# Patient Record
Sex: Female | Born: 1935 | Race: White | Hispanic: No | Smoking: Never smoker
Health system: Southern US, Community
[De-identification: ages and names within clinical notes are randomized; demographics above are authoritative.]

## PROBLEM LIST (undated history)

## (undated) DIAGNOSIS — H9190 Unspecified hearing loss, unspecified ear: Secondary | ICD-10-CM

## (undated) DIAGNOSIS — K8020 Calculus of gallbladder without cholecystitis without obstruction: Secondary | ICD-10-CM

## (undated) DIAGNOSIS — N63 Unspecified lump in unspecified breast: Secondary | ICD-10-CM

## (undated) DIAGNOSIS — C50919 Malignant neoplasm of unspecified site of unspecified female breast: Secondary | ICD-10-CM

## (undated) DIAGNOSIS — R634 Abnormal weight loss: Secondary | ICD-10-CM

## (undated) DIAGNOSIS — G9520 Unspecified cord compression: Secondary | ICD-10-CM

## (undated) HISTORY — PX: CATARACT EXTRACTION: SUR2

## (undated) HISTORY — DX: Unspecified lump in unspecified breast: N63.0

## (undated) HISTORY — DX: Abnormal weight loss: R63.4

## (undated) HISTORY — DX: Calculus of gallbladder without cholecystitis without obstruction: K80.20

## (undated) HISTORY — DX: Unspecified hearing loss, unspecified ear: H91.90

## (undated) HISTORY — DX: Unspecified cord compression: G95.20

---

## 2007-01-31 ENCOUNTER — Ambulatory Visit: Payer: Self-pay | Admitting: Oncology

## 2007-01-31 ENCOUNTER — Inpatient Hospital Stay (HOSPITAL_COMMUNITY): Admission: EM | Admit: 2007-01-31 | Discharge: 2007-02-05 | Payer: Self-pay | Admitting: Emergency Medicine

## 2007-01-31 ENCOUNTER — Ambulatory Visit: Payer: Self-pay | Admitting: Internal Medicine

## 2007-02-02 ENCOUNTER — Encounter (INDEPENDENT_AMBULATORY_CARE_PROVIDER_SITE_OTHER): Payer: Self-pay | Admitting: Diagnostic Radiology

## 2007-02-02 ENCOUNTER — Encounter: Admission: RE | Admit: 2007-02-02 | Discharge: 2007-02-02 | Payer: Self-pay | Admitting: *Deleted

## 2007-02-07 ENCOUNTER — Ambulatory Visit: Admission: RE | Admit: 2007-02-07 | Discharge: 2007-02-28 | Payer: Self-pay | Admitting: Radiation Oncology

## 2007-02-08 ENCOUNTER — Ambulatory Visit: Payer: Self-pay | Admitting: Oncology

## 2007-02-09 LAB — CBC WITH DIFFERENTIAL/PLATELET
BASO%: 0 % (ref 0.0–2.0)
EOS%: 0 % (ref 0.0–7.0)
MCH: 30.4 pg (ref 26.0–34.0)
MCHC: 33.5 g/dL (ref 32.0–36.0)
MONO#: 0.8 10*3/uL (ref 0.1–0.9)
RBC: 3.55 10*6/uL — ABNORMAL LOW (ref 3.70–5.32)
RDW: 17.2 % — ABNORMAL HIGH (ref 11.3–14.5)
WBC: 7.6 10*3/uL (ref 3.9–10.0)
lymph#: 0.4 10*3/uL — ABNORMAL LOW (ref 0.9–3.3)

## 2007-02-09 LAB — COMPREHENSIVE METABOLIC PANEL
ALT: 18 U/L (ref 0–35)
AST: 33 U/L (ref 0–37)
CO2: 24 mEq/L (ref 19–32)
Calcium: 8.8 mg/dL (ref 8.4–10.5)
Chloride: 100 mEq/L (ref 96–112)
Creatinine, Ser: 0.88 mg/dL (ref 0.40–1.20)
Sodium: 138 mEq/L (ref 135–145)
Total Protein: 6.9 g/dL (ref 6.0–8.3)

## 2007-02-22 LAB — COMPREHENSIVE METABOLIC PANEL
AST: 42 U/L — ABNORMAL HIGH (ref 0–37)
Albumin: 3.5 g/dL (ref 3.5–5.2)
BUN: 23 mg/dL (ref 6–23)
Calcium: 8.5 mg/dL (ref 8.4–10.5)
Chloride: 98 mEq/L (ref 96–112)
Creatinine, Ser: 0.73 mg/dL (ref 0.40–1.20)
Glucose, Bld: 307 mg/dL — ABNORMAL HIGH (ref 70–99)
Potassium: 4.8 mEq/L (ref 3.5–5.3)

## 2007-02-22 LAB — CBC WITH DIFFERENTIAL/PLATELET
Basophils Absolute: 0 10*3/uL (ref 0.0–0.1)
EOS%: 0.2 % (ref 0.0–7.0)
Eosinophils Absolute: 0 10*3/uL (ref 0.0–0.5)
HCT: 33.6 % — ABNORMAL LOW (ref 34.8–46.6)
HGB: 11.8 g/dL (ref 11.6–15.9)
MCH: 31.3 pg (ref 26.0–34.0)
MCV: 89.4 fL (ref 81.0–101.0)
NEUT#: 5.5 10*3/uL (ref 1.5–6.5)
NEUT%: 94.7 % — ABNORMAL HIGH (ref 39.6–76.8)
RDW: 17.7 % — ABNORMAL HIGH (ref 11.3–14.5)
lymph#: 0.2 10*3/uL — ABNORMAL LOW (ref 0.9–3.3)

## 2007-02-22 LAB — LACTATE DEHYDROGENASE: LDH: 340 U/L — ABNORMAL HIGH (ref 94–250)

## 2007-02-24 LAB — BASIC METABOLIC PANEL
Chloride: 96 mEq/L (ref 96–112)
Potassium: 4.6 mEq/L (ref 3.5–5.3)

## 2007-03-16 LAB — COMPREHENSIVE METABOLIC PANEL
Albumin: 3.6 g/dL (ref 3.5–5.2)
Alkaline Phosphatase: 107 U/L (ref 39–117)
BUN: 21 mg/dL (ref 6–23)
Glucose, Bld: 155 mg/dL — ABNORMAL HIGH (ref 70–99)
Potassium: 4.8 mEq/L (ref 3.5–5.3)

## 2007-03-16 LAB — CBC WITH DIFFERENTIAL/PLATELET
Basophils Absolute: 0 10*3/uL (ref 0.0–0.1)
Eosinophils Absolute: 0 10*3/uL (ref 0.0–0.5)
HCT: 32.2 % — ABNORMAL LOW (ref 34.8–46.6)
HGB: 11 g/dL — ABNORMAL LOW (ref 11.6–15.9)
LYMPH%: 8.8 % — ABNORMAL LOW (ref 14.0–48.0)
MCV: 92.2 fL (ref 81.0–101.0)
MONO%: 6.4 % (ref 0.0–13.0)
NEUT#: 6.3 10*3/uL (ref 1.5–6.5)
Platelets: 240 10*3/uL (ref 145–400)
RDW: 19.5 % — ABNORMAL HIGH (ref 11.3–14.5)

## 2007-03-16 LAB — LACTATE DEHYDROGENASE: LDH: 374 U/L — ABNORMAL HIGH (ref 94–250)

## 2007-03-16 LAB — CANCER ANTIGEN 27.29: CA 27.29: 2196 U/mL — ABNORMAL HIGH (ref 0–39)

## 2007-04-06 ENCOUNTER — Ambulatory Visit: Payer: Self-pay | Admitting: Oncology

## 2007-04-08 LAB — CBC WITH DIFFERENTIAL/PLATELET
Basophils Absolute: 0 10*3/uL (ref 0.0–0.1)
HCT: 30 % — ABNORMAL LOW (ref 34.8–46.6)
HGB: 10 g/dL — ABNORMAL LOW (ref 11.6–15.9)
MONO#: 0.6 10*3/uL (ref 0.1–0.9)
NEUT%: 72.9 % (ref 39.6–76.8)
WBC: 6.3 10*3/uL (ref 3.9–10.0)
lymph#: 1 10*3/uL (ref 0.9–3.3)

## 2007-04-09 LAB — COMPREHENSIVE METABOLIC PANEL
BUN: 12 mg/dL (ref 6–23)
CO2: 25 mEq/L (ref 19–32)
Calcium: 8.4 mg/dL (ref 8.4–10.5)
Chloride: 106 mEq/L (ref 96–112)
Creatinine, Ser: 0.61 mg/dL (ref 0.40–1.20)

## 2007-04-09 LAB — LACTATE DEHYDROGENASE: LDH: 363 U/L — ABNORMAL HIGH (ref 94–250)

## 2007-04-09 LAB — CANCER ANTIGEN 27.29: CA 27.29: 1098 U/mL — ABNORMAL HIGH (ref 0–39)

## 2007-05-04 ENCOUNTER — Ambulatory Visit (HOSPITAL_COMMUNITY): Admission: RE | Admit: 2007-05-04 | Discharge: 2007-05-04 | Payer: Self-pay | Admitting: Oncology

## 2007-05-04 ENCOUNTER — Encounter (INDEPENDENT_AMBULATORY_CARE_PROVIDER_SITE_OTHER): Payer: Self-pay | Admitting: *Deleted

## 2007-05-04 LAB — CBC WITH DIFFERENTIAL/PLATELET
Basophils Absolute: 0 10*3/uL (ref 0.0–0.1)
Eosinophils Absolute: 0.4 10*3/uL (ref 0.0–0.5)
HCT: 29.1 % — ABNORMAL LOW (ref 34.8–46.6)
LYMPH%: 13.1 % — ABNORMAL LOW (ref 14.0–48.0)
MCV: 87.8 fL (ref 81.0–101.0)
MONO#: 0.8 10*3/uL (ref 0.1–0.9)
MONO%: 11.5 % (ref 0.0–13.0)
NEUT#: 4.9 10*3/uL (ref 1.5–6.5)
NEUT%: 68.9 % (ref 39.6–76.8)
Platelets: 369 10*3/uL (ref 145–400)
WBC: 7.1 10*3/uL (ref 3.9–10.0)

## 2007-05-05 LAB — IRON AND TIBC
Iron: 55 ug/dL (ref 42–145)
UIBC: 263 ug/dL

## 2007-05-05 LAB — COMPREHENSIVE METABOLIC PANEL
BUN: 13 mg/dL (ref 6–23)
CO2: 20 mEq/L (ref 19–32)
Creatinine, Ser: 0.63 mg/dL (ref 0.40–1.20)
Glucose, Bld: 130 mg/dL — ABNORMAL HIGH (ref 70–99)
Total Bilirubin: 0.6 mg/dL (ref 0.3–1.2)
Total Protein: 7.1 g/dL (ref 6.0–8.3)

## 2007-05-05 LAB — CANCER ANTIGEN 27.29: CA 27.29: 477 U/mL — ABNORMAL HIGH (ref 0–39)

## 2007-05-05 LAB — LACTATE DEHYDROGENASE: LDH: 435 U/L — ABNORMAL HIGH (ref 94–250)

## 2007-05-05 LAB — CEA: CEA: 70.4 ng/mL — ABNORMAL HIGH (ref 0.0–5.0)

## 2007-06-01 ENCOUNTER — Ambulatory Visit: Payer: Self-pay | Admitting: Oncology

## 2007-06-03 ENCOUNTER — Ambulatory Visit (HOSPITAL_COMMUNITY): Admission: RE | Admit: 2007-06-03 | Discharge: 2007-06-03 | Payer: Self-pay | Admitting: Oncology

## 2007-06-03 LAB — COMPREHENSIVE METABOLIC PANEL
AST: 22 U/L (ref 0–37)
Alkaline Phosphatase: 108 U/L (ref 39–117)
BUN: 16 mg/dL (ref 6–23)
Creatinine, Ser: 0.65 mg/dL (ref 0.40–1.20)
Glucose, Bld: 113 mg/dL — ABNORMAL HIGH (ref 70–99)
Potassium: 3.7 mEq/L (ref 3.5–5.3)
Total Bilirubin: 0.6 mg/dL (ref 0.3–1.2)

## 2007-06-03 LAB — CANCER ANTIGEN 27.29: CA 27.29: 331 U/mL — ABNORMAL HIGH (ref 0–39)

## 2007-06-03 LAB — CBC WITH DIFFERENTIAL/PLATELET
Basophils Absolute: 0 10*3/uL (ref 0.0–0.1)
EOS%: 9.6 % — ABNORMAL HIGH (ref 0.0–7.0)
Eosinophils Absolute: 0.5 10*3/uL (ref 0.0–0.5)
HGB: 10.5 g/dL — ABNORMAL LOW (ref 11.6–15.9)
LYMPH%: 18.4 % (ref 14.0–48.0)
MCH: 27.1 pg (ref 26.0–34.0)
MCV: 86.1 fL (ref 81.0–101.0)
MONO%: 12.6 % (ref 0.0–13.0)
NEUT#: 3 10*3/uL (ref 1.5–6.5)
Platelets: 329 10*3/uL (ref 145–400)
RDW: 19.7 % — ABNORMAL HIGH (ref 11.3–14.5)

## 2007-06-03 LAB — LACTATE DEHYDROGENASE: LDH: 221 U/L (ref 94–250)

## 2007-06-03 LAB — CEA: CEA: 55.3 ng/mL — ABNORMAL HIGH (ref 0.0–5.0)

## 2007-07-14 ENCOUNTER — Ambulatory Visit: Payer: Self-pay | Admitting: Oncology

## 2007-07-18 LAB — CBC WITH DIFFERENTIAL/PLATELET
Basophils Absolute: 0.1 10*3/uL (ref 0.0–0.1)
EOS%: 5.4 % (ref 0.0–7.0)
Eosinophils Absolute: 0.4 10*3/uL (ref 0.0–0.5)
HCT: 35.2 % (ref 34.8–46.6)
HGB: 11.8 g/dL (ref 11.6–15.9)
MCH: 28.4 pg (ref 26.0–34.0)
MCV: 84.7 fL (ref 81.0–101.0)
MONO%: 11.6 % (ref 0.0–13.0)
NEUT#: 4.3 10*3/uL (ref 1.5–6.5)
NEUT%: 60.4 % (ref 39.6–76.8)
Platelets: 227 10*3/uL (ref 145–400)
RDW: 17.1 % — ABNORMAL HIGH (ref 11.3–14.5)

## 2007-07-18 LAB — COMPREHENSIVE METABOLIC PANEL
AST: 21 U/L (ref 0–37)
Albumin: 4.1 g/dL (ref 3.5–5.2)
Alkaline Phosphatase: 97 U/L (ref 39–117)
Potassium: 3.9 mEq/L (ref 3.5–5.3)
Sodium: 143 mEq/L (ref 135–145)
Total Bilirubin: 0.7 mg/dL (ref 0.3–1.2)
Total Protein: 7.2 g/dL (ref 6.0–8.3)

## 2007-07-18 LAB — CEA: CEA: 45.5 ng/mL — ABNORMAL HIGH (ref 0.0–5.0)

## 2007-07-18 LAB — LACTATE DEHYDROGENASE: LDH: 216 U/L (ref 94–250)

## 2007-08-24 ENCOUNTER — Ambulatory Visit: Payer: Self-pay | Admitting: Oncology

## 2007-08-29 LAB — CBC WITH DIFFERENTIAL/PLATELET
Basophils Absolute: 0.1 10*3/uL (ref 0.0–0.1)
EOS%: 5.8 % (ref 0.0–7.0)
Eosinophils Absolute: 0.3 10*3/uL (ref 0.0–0.5)
HCT: 36.2 % (ref 34.8–46.6)
HGB: 11.8 g/dL (ref 11.6–15.9)
MONO#: 0.8 10*3/uL (ref 0.1–0.9)
NEUT#: 3 10*3/uL (ref 1.5–6.5)
NEUT%: 52.8 % (ref 39.6–76.8)
RDW: 16.4 % — ABNORMAL HIGH (ref 11.3–14.5)
WBC: 5.7 10*3/uL (ref 3.9–10.0)
lymph#: 1.5 10*3/uL (ref 0.9–3.3)

## 2007-08-30 LAB — COMPREHENSIVE METABOLIC PANEL
ALT: 12 U/L (ref 0–35)
AST: 21 U/L (ref 0–37)
Albumin: 4.1 g/dL (ref 3.5–5.2)
Alkaline Phosphatase: 83 U/L (ref 39–117)
Glucose, Bld: 94 mg/dL (ref 70–99)
Potassium: 4 mEq/L (ref 3.5–5.3)
Sodium: 142 mEq/L (ref 135–145)
Total Bilirubin: 0.8 mg/dL (ref 0.3–1.2)
Total Protein: 7.2 g/dL (ref 6.0–8.3)

## 2007-10-18 ENCOUNTER — Ambulatory Visit: Payer: Self-pay | Admitting: Oncology

## 2007-10-20 LAB — COMPREHENSIVE METABOLIC PANEL
AST: 27 U/L (ref 0–37)
Albumin: 4.3 g/dL (ref 3.5–5.2)
Alkaline Phosphatase: 76 U/L (ref 39–117)
BUN: 9 mg/dL (ref 6–23)
Calcium: 8.8 mg/dL (ref 8.4–10.5)
Creatinine, Ser: 0.73 mg/dL (ref 0.40–1.20)
Glucose, Bld: 116 mg/dL — ABNORMAL HIGH (ref 70–99)
Potassium: 3.7 mEq/L (ref 3.5–5.3)

## 2007-10-20 LAB — CANCER ANTIGEN 27.29: CA 27.29: 108 U/mL — ABNORMAL HIGH (ref 0–39)

## 2007-10-20 LAB — CBC WITH DIFFERENTIAL/PLATELET
BASO%: 0.5 % (ref 0.0–2.0)
EOS%: 2.4 % (ref 0.0–7.0)
HCT: 38.4 % (ref 34.8–46.6)
HGB: 12.8 g/dL (ref 11.6–15.9)
MCH: 28.2 pg (ref 26.0–34.0)
MCHC: 33.3 g/dL (ref 32.0–36.0)
MONO#: 0.6 10*3/uL (ref 0.1–0.9)
RDW: 16 % — ABNORMAL HIGH (ref 11.3–14.5)
WBC: 6.5 10*3/uL (ref 3.9–10.0)
lymph#: 1.8 10*3/uL (ref 0.9–3.3)

## 2007-10-20 LAB — CEA: CEA: 35.3 ng/mL — ABNORMAL HIGH (ref 0.0–5.0)

## 2007-12-01 LAB — CBC WITH DIFFERENTIAL/PLATELET
BASO%: 0.4 % (ref 0.0–2.0)
LYMPH%: 25.7 % (ref 14.0–48.0)
MCHC: 34.3 g/dL (ref 32.0–36.0)
MCV: 85.4 fL (ref 81.0–101.0)
MONO%: 12.6 % (ref 0.0–13.0)
Platelets: 215 10*3/uL (ref 145–400)
RBC: 4.28 10*6/uL (ref 3.70–5.32)
RDW: 15.8 % — ABNORMAL HIGH (ref 11.3–14.5)
WBC: 5.4 10*3/uL (ref 3.9–10.0)

## 2007-12-01 LAB — COMPREHENSIVE METABOLIC PANEL
ALT: 11 U/L (ref 0–35)
AST: 18 U/L (ref 0–37)
Alkaline Phosphatase: 81 U/L (ref 39–117)
Sodium: 141 mEq/L (ref 135–145)
Total Bilirubin: 0.8 mg/dL (ref 0.3–1.2)
Total Protein: 7 g/dL (ref 6.0–8.3)

## 2008-01-27 ENCOUNTER — Ambulatory Visit: Payer: Self-pay | Admitting: Oncology

## 2008-01-31 ENCOUNTER — Ambulatory Visit (HOSPITAL_COMMUNITY): Admission: RE | Admit: 2008-01-31 | Discharge: 2008-01-31 | Payer: Self-pay | Admitting: Oncology

## 2008-01-31 LAB — CBC WITH DIFFERENTIAL/PLATELET
BASO%: 0.4 % (ref 0.0–2.0)
EOS%: 1.5 % (ref 0.0–7.0)
MCH: 29.5 pg (ref 26.0–34.0)
MCHC: 33.8 g/dL (ref 32.0–36.0)
RBC: 4.42 10*6/uL (ref 3.70–5.32)
RDW: 15.2 % — ABNORMAL HIGH (ref 11.3–14.5)
lymph#: 1.5 10*3/uL (ref 0.9–3.3)

## 2008-02-01 LAB — COMPREHENSIVE METABOLIC PANEL
ALT: 11 U/L (ref 0–35)
AST: 17 U/L (ref 0–37)
Albumin: 4.5 g/dL (ref 3.5–5.2)
Calcium: 9.4 mg/dL (ref 8.4–10.5)
Chloride: 107 mEq/L (ref 96–112)
Creatinine, Ser: 0.84 mg/dL (ref 0.40–1.20)
Potassium: 3.8 mEq/L (ref 3.5–5.3)
Sodium: 146 mEq/L — ABNORMAL HIGH (ref 135–145)
Total Protein: 7.3 g/dL (ref 6.0–8.3)

## 2008-03-29 ENCOUNTER — Ambulatory Visit: Payer: Self-pay | Admitting: Oncology

## 2008-04-02 LAB — COMPREHENSIVE METABOLIC PANEL
AST: 19 U/L (ref 0–37)
Albumin: 4.3 g/dL (ref 3.5–5.2)
Alkaline Phosphatase: 97 U/L (ref 39–117)
Glucose, Bld: 91 mg/dL (ref 70–99)
Potassium: 3.8 mEq/L (ref 3.5–5.3)
Sodium: 144 mEq/L (ref 135–145)
Total Bilirubin: 1 mg/dL (ref 0.3–1.2)
Total Protein: 7.2 g/dL (ref 6.0–8.3)

## 2008-04-02 LAB — CBC WITH DIFFERENTIAL/PLATELET
BASO%: 0.4 % (ref 0.0–2.0)
EOS%: 2.4 % (ref 0.0–7.0)
Eosinophils Absolute: 0.1 10*3/uL (ref 0.0–0.5)
LYMPH%: 25 % (ref 14.0–48.0)
MCH: 29.5 pg (ref 26.0–34.0)
MCHC: 33.9 g/dL (ref 32.0–36.0)
MCV: 86.9 fL (ref 81.0–101.0)
MONO%: 9.2 % (ref 0.0–13.0)
NEUT#: 3.4 10*3/uL (ref 1.5–6.5)
Platelets: 216 10*3/uL (ref 145–400)
RBC: 4.55 10*6/uL (ref 3.70–5.32)
RDW: 14.8 % — ABNORMAL HIGH (ref 11.3–14.5)

## 2008-04-02 LAB — CEA: CEA: 29 ng/mL — ABNORMAL HIGH (ref 0.0–5.0)

## 2008-04-02 LAB — CANCER ANTIGEN 27.29: CA 27.29: 74 U/mL — ABNORMAL HIGH (ref 0–39)

## 2008-05-31 ENCOUNTER — Ambulatory Visit: Payer: Self-pay | Admitting: Oncology

## 2008-06-04 LAB — COMPREHENSIVE METABOLIC PANEL
ALT: 13 U/L (ref 0–35)
Alkaline Phosphatase: 90 U/L (ref 39–117)
Glucose, Bld: 108 mg/dL — ABNORMAL HIGH (ref 70–99)
Sodium: 142 mEq/L (ref 135–145)
Total Bilirubin: 1.2 mg/dL (ref 0.3–1.2)
Total Protein: 6.9 g/dL (ref 6.0–8.3)

## 2008-06-04 LAB — CBC WITH DIFFERENTIAL/PLATELET
EOS%: 2.2 % (ref 0.0–7.0)
HCT: 38.7 % (ref 34.8–46.6)
HGB: 13.3 g/dL (ref 11.6–15.9)
MCHC: 34.3 g/dL (ref 32.0–36.0)
MONO%: 9.1 % (ref 0.0–13.0)
NEUT#: 3.6 10*3/uL (ref 1.5–6.5)
Platelets: 224 10*3/uL (ref 145–400)
RDW: 14.9 % — ABNORMAL HIGH (ref 11.3–14.5)
WBC: 6 10*3/uL (ref 3.9–10.0)

## 2008-06-04 LAB — LACTATE DEHYDROGENASE: LDH: 166 U/L (ref 94–250)

## 2008-08-01 ENCOUNTER — Ambulatory Visit: Payer: Self-pay | Admitting: Oncology

## 2008-08-03 LAB — COMPREHENSIVE METABOLIC PANEL
ALT: 10 U/L (ref 0–35)
AST: 19 U/L (ref 0–37)
Albumin: 4.3 g/dL (ref 3.5–5.2)
BUN: 15 mg/dL (ref 6–23)
Calcium: 9.1 mg/dL (ref 8.4–10.5)
Chloride: 108 mEq/L (ref 96–112)
Potassium: 3.9 mEq/L (ref 3.5–5.3)
Sodium: 143 mEq/L (ref 135–145)
Total Protein: 7 g/dL (ref 6.0–8.3)

## 2008-08-03 LAB — CBC WITH DIFFERENTIAL/PLATELET
Basophils Absolute: 0 10*3/uL (ref 0.0–0.1)
EOS%: 5 % (ref 0.0–7.0)
HGB: 12.7 g/dL (ref 11.6–15.9)
MCH: 29.9 pg (ref 25.1–34.0)
NEUT#: 4.1 10*3/uL (ref 1.5–6.5)
RDW: 14.8 % — ABNORMAL HIGH (ref 11.2–14.5)
lymph#: 1.7 10*3/uL (ref 0.9–3.3)

## 2008-08-03 LAB — CANCER ANTIGEN 27.29: CA 27.29: 61 U/mL — ABNORMAL HIGH (ref 0–39)

## 2008-10-02 ENCOUNTER — Ambulatory Visit: Payer: Self-pay | Admitting: Oncology

## 2008-10-04 LAB — COMPREHENSIVE METABOLIC PANEL
AST: 14 U/L (ref 0–37)
Albumin: 4.1 g/dL (ref 3.5–5.2)
BUN: 12 mg/dL (ref 6–23)
Calcium: 9.1 mg/dL (ref 8.4–10.5)
Chloride: 107 mEq/L (ref 96–112)
Glucose, Bld: 97 mg/dL (ref 70–99)
Potassium: 4 mEq/L (ref 3.5–5.3)
Total Protein: 7.1 g/dL (ref 6.0–8.3)

## 2008-10-04 LAB — CBC WITH DIFFERENTIAL/PLATELET
Basophils Absolute: 0 10*3/uL (ref 0.0–0.1)
EOS%: 3.3 % (ref 0.0–7.0)
Eosinophils Absolute: 0.2 10*3/uL (ref 0.0–0.5)
HGB: 13 g/dL (ref 11.6–15.9)
NEUT#: 3.7 10*3/uL (ref 1.5–6.5)
RDW: 14.4 % (ref 11.2–14.5)
lymph#: 1.5 10*3/uL (ref 0.9–3.3)

## 2008-10-04 LAB — CANCER ANTIGEN 27.29: CA 27.29: 58 U/mL — ABNORMAL HIGH (ref 0–39)

## 2008-10-04 LAB — CEA: CEA: 19.3 ng/mL — ABNORMAL HIGH (ref 0.0–5.0)

## 2009-01-03 ENCOUNTER — Ambulatory Visit: Payer: Self-pay | Admitting: Oncology

## 2009-01-07 ENCOUNTER — Ambulatory Visit (HOSPITAL_COMMUNITY): Admission: RE | Admit: 2009-01-07 | Discharge: 2009-01-07 | Payer: Self-pay | Admitting: Oncology

## 2009-01-07 LAB — CBC WITH DIFFERENTIAL/PLATELET
BASO%: 0.3 % (ref 0.0–2.0)
EOS%: 2.5 % (ref 0.0–7.0)
LYMPH%: 21.1 % (ref 14.0–49.7)
MCHC: 34 g/dL (ref 31.5–36.0)
MONO#: 0.5 10*3/uL (ref 0.1–0.9)
MONO%: 8.5 % (ref 0.0–14.0)
Platelets: 192 10*3/uL (ref 145–400)
RBC: 4.44 10*6/uL (ref 3.70–5.45)
WBC: 5.9 10*3/uL (ref 3.9–10.3)

## 2009-01-07 LAB — COMPREHENSIVE METABOLIC PANEL
ALT: 9 U/L (ref 0–35)
AST: 19 U/L (ref 0–37)
Alkaline Phosphatase: 80 U/L (ref 39–117)
CO2: 26 mEq/L (ref 19–32)
Sodium: 142 mEq/L (ref 135–145)
Total Bilirubin: 1.3 mg/dL — ABNORMAL HIGH (ref 0.3–1.2)
Total Protein: 6.9 g/dL (ref 6.0–8.3)

## 2009-01-07 LAB — CEA: CEA: 18.4 ng/mL — ABNORMAL HIGH (ref 0.0–5.0)

## 2009-01-07 LAB — CANCER ANTIGEN 27.29: CA 27.29: 63 U/mL — ABNORMAL HIGH (ref 0–39)

## 2009-04-04 ENCOUNTER — Ambulatory Visit: Payer: Self-pay | Admitting: Oncology

## 2009-04-08 LAB — COMPREHENSIVE METABOLIC PANEL
ALT: 10 U/L (ref 0–35)
AST: 19 U/L (ref 0–37)
CO2: 24 mEq/L (ref 19–32)
Sodium: 141 mEq/L (ref 135–145)
Total Bilirubin: 0.8 mg/dL (ref 0.3–1.2)
Total Protein: 7.2 g/dL (ref 6.0–8.3)

## 2009-04-08 LAB — CBC WITH DIFFERENTIAL/PLATELET
BASO%: 0.3 % (ref 0.0–2.0)
EOS%: 4.2 % (ref 0.0–7.0)
LYMPH%: 19 % (ref 14.0–49.7)
MCH: 29.1 pg (ref 25.1–34.0)
MCHC: 32.4 g/dL (ref 31.5–36.0)
MONO#: 0.8 10*3/uL (ref 0.1–0.9)
Platelets: 273 10*3/uL (ref 145–400)
RBC: 4.43 10*6/uL (ref 3.70–5.45)
WBC: 6.1 10*3/uL (ref 3.9–10.3)
lymph#: 1.2 10*3/uL (ref 0.9–3.3)

## 2009-04-08 LAB — CANCER ANTIGEN 27.29: CA 27.29: 65 U/mL — ABNORMAL HIGH (ref 0–39)

## 2009-04-08 LAB — LACTATE DEHYDROGENASE: LDH: 198 U/L (ref 94–250)

## 2009-07-04 ENCOUNTER — Ambulatory Visit: Payer: Self-pay | Admitting: Oncology

## 2009-07-08 LAB — CANCER ANTIGEN 27.29: CA 27.29: 59 U/mL — ABNORMAL HIGH (ref 0–39)

## 2009-07-08 LAB — CBC WITH DIFFERENTIAL/PLATELET
Basophils Absolute: 0 10*3/uL (ref 0.0–0.1)
EOS%: 3.3 % (ref 0.0–7.0)
HCT: 38.8 % (ref 34.8–46.6)
HGB: 13.2 g/dL (ref 11.6–15.9)
MCH: 30 pg (ref 25.1–34.0)
MCV: 88.2 fL (ref 79.5–101.0)
MONO%: 8.3 % (ref 0.0–14.0)
NEUT%: 67.7 % (ref 38.4–76.8)
lymph#: 1.3 10*3/uL (ref 0.9–3.3)

## 2009-07-08 LAB — CEA: CEA: 19.8 ng/mL — ABNORMAL HIGH (ref 0.0–5.0)

## 2009-07-08 LAB — COMPREHENSIVE METABOLIC PANEL
AST: 19 U/L (ref 0–37)
BUN: 13 mg/dL (ref 6–23)
Calcium: 9 mg/dL (ref 8.4–10.5)
Chloride: 106 mEq/L (ref 96–112)
Creatinine, Ser: 0.86 mg/dL (ref 0.40–1.20)
Glucose, Bld: 95 mg/dL (ref 70–99)

## 2009-08-06 ENCOUNTER — Ambulatory Visit: Payer: Self-pay | Admitting: Oncology

## 2009-08-22 ENCOUNTER — Ambulatory Visit (HOSPITAL_COMMUNITY): Admission: RE | Admit: 2009-08-22 | Discharge: 2009-08-22 | Payer: Self-pay | Admitting: Oncology

## 2009-09-13 ENCOUNTER — Ambulatory Visit: Payer: Self-pay | Admitting: Oncology

## 2009-09-16 LAB — CBC WITH DIFFERENTIAL/PLATELET
BASO%: 0.4 % (ref 0.0–2.0)
EOS%: 2.6 % (ref 0.0–7.0)
HCT: 39 % (ref 34.8–46.6)
MCH: 29.7 pg (ref 25.1–34.0)
MCHC: 33.7 g/dL (ref 31.5–36.0)
MONO#: 0.6 10*3/uL (ref 0.1–0.9)
NEUT%: 67.2 % (ref 38.4–76.8)
RDW: 14.5 % (ref 11.2–14.5)
WBC: 6.5 10*3/uL (ref 3.9–10.3)
lymph#: 1.4 10*3/uL (ref 0.9–3.3)

## 2009-09-16 LAB — COMPREHENSIVE METABOLIC PANEL
ALT: 9 U/L (ref 0–35)
AST: 19 U/L (ref 0–37)
Alkaline Phosphatase: 78 U/L (ref 39–117)
Sodium: 140 mEq/L (ref 135–145)
Total Bilirubin: 1 mg/dL (ref 0.3–1.2)
Total Protein: 7.1 g/dL (ref 6.0–8.3)

## 2009-09-16 LAB — LACTATE DEHYDROGENASE: LDH: 160 U/L (ref 94–250)

## 2009-10-01 ENCOUNTER — Ambulatory Visit: Admission: RE | Admit: 2009-10-01 | Discharge: 2009-11-01 | Payer: Self-pay | Admitting: Radiation Oncology

## 2009-10-31 ENCOUNTER — Ambulatory Visit: Payer: Self-pay | Admitting: Oncology

## 2009-11-04 LAB — COMPREHENSIVE METABOLIC PANEL
AST: 21 U/L (ref 0–37)
Alkaline Phosphatase: 79 U/L (ref 39–117)
BUN: 16 mg/dL (ref 6–23)
Calcium: 9.3 mg/dL (ref 8.4–10.5)
Chloride: 108 mEq/L (ref 96–112)
Creatinine, Ser: 0.81 mg/dL (ref 0.40–1.20)
Glucose, Bld: 119 mg/dL — ABNORMAL HIGH (ref 70–99)

## 2009-11-04 LAB — CBC WITH DIFFERENTIAL/PLATELET
Basophils Absolute: 0 10*3/uL (ref 0.0–0.1)
EOS%: 3.7 % (ref 0.0–7.0)
Eosinophils Absolute: 0.2 10*3/uL (ref 0.0–0.5)
HCT: 36.8 % (ref 34.8–46.6)
HGB: 12.7 g/dL (ref 11.6–15.9)
MCH: 30.5 pg (ref 25.1–34.0)
MCV: 88.3 fL (ref 79.5–101.0)
MONO%: 9.1 % (ref 0.0–14.0)
NEUT%: 65.7 % (ref 38.4–76.8)
Platelets: 215 10*3/uL (ref 145–400)

## 2009-11-04 LAB — CANCER ANTIGEN 27.29: CA 27.29: 62 U/mL — ABNORMAL HIGH (ref 0–39)

## 2009-11-04 LAB — CEA: CEA: 18.6 ng/mL — ABNORMAL HIGH (ref 0.0–5.0)

## 2009-11-18 ENCOUNTER — Encounter: Admission: AD | Admit: 2009-11-18 | Discharge: 2009-11-18 | Payer: Self-pay | Admitting: Dentistry

## 2009-11-18 ENCOUNTER — Ambulatory Visit: Payer: Self-pay | Admitting: Dentistry

## 2009-12-11 ENCOUNTER — Ambulatory Visit: Payer: Self-pay | Admitting: Dentistry

## 2009-12-20 ENCOUNTER — Ambulatory Visit: Payer: Self-pay | Admitting: Oncology

## 2009-12-24 LAB — CBC WITH DIFFERENTIAL/PLATELET
Eosinophils Absolute: 0.2 10*3/uL (ref 0.0–0.5)
LYMPH%: 21 % (ref 14.0–49.7)
MCHC: 33.8 g/dL (ref 31.5–36.0)
MCV: 88.4 fL (ref 79.5–101.0)
MONO#: 0.4 10*3/uL (ref 0.1–0.9)
MONO%: 8.2 % (ref 0.0–14.0)

## 2009-12-24 LAB — COMPREHENSIVE METABOLIC PANEL
ALT: 10 U/L (ref 0–35)
AST: 20 U/L (ref 0–37)
Alkaline Phosphatase: 71 U/L (ref 39–117)
CO2: 26 mEq/L (ref 19–32)
Sodium: 142 mEq/L (ref 135–145)
Total Bilirubin: 0.9 mg/dL (ref 0.3–1.2)
Total Protein: 6.8 g/dL (ref 6.0–8.3)

## 2009-12-24 LAB — LACTATE DEHYDROGENASE: LDH: 171 U/L (ref 94–250)

## 2010-01-31 ENCOUNTER — Ambulatory Visit: Payer: Self-pay | Admitting: Oncology

## 2010-02-04 LAB — COMPREHENSIVE METABOLIC PANEL
AST: 18 U/L (ref 0–37)
Albumin: 4.1 g/dL (ref 3.5–5.2)
BUN: 11 mg/dL (ref 6–23)
CO2: 28 mEq/L (ref 19–32)
Chloride: 108 mEq/L (ref 96–112)
Creatinine, Ser: 0.89 mg/dL (ref 0.40–1.20)
Potassium: 3.9 mEq/L (ref 3.5–5.3)
Total Protein: 6.9 g/dL (ref 6.0–8.3)

## 2010-02-04 LAB — CBC WITH DIFFERENTIAL/PLATELET
Basophils Absolute: 0 10*3/uL (ref 0.0–0.1)
Eosinophils Absolute: 0.1 10*3/uL (ref 0.0–0.5)
HCT: 36.5 % (ref 34.8–46.6)
HGB: 12.6 g/dL (ref 11.6–15.9)
MCH: 30.4 pg (ref 25.1–34.0)
MCHC: 34.4 g/dL (ref 31.5–36.0)
MONO#: 0.6 10*3/uL (ref 0.1–0.9)
Platelets: 191 10*3/uL (ref 145–400)
WBC: 5.3 10*3/uL (ref 3.9–10.3)

## 2010-02-04 LAB — CEA: CEA: 15.9 ng/mL — ABNORMAL HIGH (ref 0.0–5.0)

## 2010-02-18 ENCOUNTER — Ambulatory Visit: Payer: Self-pay | Admitting: Dentistry

## 2010-03-12 ENCOUNTER — Ambulatory Visit: Payer: Self-pay | Admitting: Oncology

## 2010-03-28 LAB — BASIC METABOLIC PANEL
CO2: 27 mEq/L (ref 19–32)
Calcium: 8.7 mg/dL (ref 8.4–10.5)
Sodium: 143 mEq/L (ref 135–145)

## 2010-04-23 ENCOUNTER — Ambulatory Visit: Payer: Self-pay | Admitting: Oncology

## 2010-05-22 LAB — CBC WITH DIFFERENTIAL/PLATELET
BASO%: 1.3 % (ref 0.0–2.0)
Basophils Absolute: 0.1 10*3/uL (ref 0.0–0.1)
EOS%: 3.9 % (ref 0.0–7.0)
HGB: 12.8 g/dL (ref 11.6–15.9)
MCV: 87.3 fL (ref 79.5–101.0)
MONO#: 0.6 10*3/uL (ref 0.1–0.9)
NEUT#: 3.7 10*3/uL (ref 1.5–6.5)
NEUT%: 60.5 % (ref 38.4–76.8)
RDW: 14.4 % (ref 11.2–14.5)
WBC: 6.1 10*3/uL (ref 3.9–10.3)

## 2010-05-22 LAB — COMPREHENSIVE METABOLIC PANEL
ALT: 10 U/L (ref 0–35)
AST: 20 U/L (ref 0–37)
Albumin: 4.4 g/dL (ref 3.5–5.2)
Alkaline Phosphatase: 68 U/L (ref 39–117)
Calcium: 9.6 mg/dL (ref 8.4–10.5)
Sodium: 140 mEq/L (ref 135–145)
Total Bilirubin: 0.8 mg/dL (ref 0.3–1.2)

## 2010-07-01 ENCOUNTER — Ambulatory Visit (HOSPITAL_COMMUNITY): Payer: Self-pay | Admitting: Dentistry

## 2010-07-01 DIAGNOSIS — K036 Deposits [accretions] on teeth: Secondary | ICD-10-CM

## 2010-07-01 DIAGNOSIS — K08109 Complete loss of teeth, unspecified cause, unspecified class: Secondary | ICD-10-CM

## 2010-07-01 DIAGNOSIS — K08409 Partial loss of teeth, unspecified cause, unspecified class: Secondary | ICD-10-CM

## 2010-07-21 ENCOUNTER — Other Ambulatory Visit (HOSPITAL_COMMUNITY): Payer: Self-pay | Admitting: Oncology

## 2010-07-21 ENCOUNTER — Encounter (HOSPITAL_BASED_OUTPATIENT_CLINIC_OR_DEPARTMENT_OTHER): Payer: Self-pay | Admitting: Oncology

## 2010-07-21 DIAGNOSIS — C50919 Malignant neoplasm of unspecified site of unspecified female breast: Secondary | ICD-10-CM

## 2010-07-21 DIAGNOSIS — C7952 Secondary malignant neoplasm of bone marrow: Secondary | ICD-10-CM

## 2010-07-21 LAB — CBC WITH DIFFERENTIAL/PLATELET
Basophils Absolute: 0 10*3/uL (ref 0.0–0.1)
Eosinophils Absolute: 0.2 10*3/uL (ref 0.0–0.5)
HCT: 38.1 % (ref 34.8–46.6)
HGB: 12.7 g/dL (ref 11.6–15.9)
MCH: 29 pg (ref 25.1–34.0)
MONO#: 0.6 10*3/uL (ref 0.1–0.9)
NEUT#: 3.6 10*3/uL (ref 1.5–6.5)
NEUT%: 64.2 % (ref 38.4–76.8)
RDW: 14.3 % (ref 11.2–14.5)
WBC: 5.7 10*3/uL (ref 3.9–10.3)
lymph#: 1.2 10*3/uL (ref 0.9–3.3)

## 2010-07-21 LAB — COMPREHENSIVE METABOLIC PANEL
ALT: 10 U/L (ref 0–35)
AST: 22 U/L (ref 0–37)
Albumin: 4.6 g/dL (ref 3.5–5.2)
BUN: 13 mg/dL (ref 6–23)
Calcium: 9.2 mg/dL (ref 8.4–10.5)
Chloride: 105 mEq/L (ref 96–112)
Potassium: 4 mEq/L (ref 3.5–5.3)
Sodium: 141 mEq/L (ref 135–145)
Total Protein: 7.6 g/dL (ref 6.0–8.3)

## 2010-07-21 LAB — CEA: CEA: 19.8 ng/mL — ABNORMAL HIGH (ref 0.0–5.0)

## 2010-07-22 ENCOUNTER — Other Ambulatory Visit (HOSPITAL_COMMUNITY): Payer: Self-pay | Admitting: Oncology

## 2010-07-22 DIAGNOSIS — C50919 Malignant neoplasm of unspecified site of unspecified female breast: Secondary | ICD-10-CM

## 2010-07-30 ENCOUNTER — Encounter (HOSPITAL_COMMUNITY): Payer: Self-pay

## 2010-07-30 ENCOUNTER — Encounter (HOSPITAL_COMMUNITY)
Admission: RE | Admit: 2010-07-30 | Discharge: 2010-07-30 | Disposition: A | Discharge status: Home / Self Care | Payer: MEDICARE | Source: Ambulatory Visit | Attending: Oncology | Admitting: Oncology

## 2010-07-30 DIAGNOSIS — R222 Localized swelling, mass and lump, trunk: Secondary | ICD-10-CM | POA: Insufficient documentation

## 2010-07-30 DIAGNOSIS — J984 Other disorders of lung: Secondary | ICD-10-CM | POA: Insufficient documentation

## 2010-07-30 DIAGNOSIS — C7951 Secondary malignant neoplasm of bone: Secondary | ICD-10-CM | POA: Insufficient documentation

## 2010-07-30 DIAGNOSIS — K7689 Other specified diseases of liver: Secondary | ICD-10-CM | POA: Insufficient documentation

## 2010-07-30 DIAGNOSIS — C50919 Malignant neoplasm of unspecified site of unspecified female breast: Secondary | ICD-10-CM

## 2010-07-30 DIAGNOSIS — J90 Pleural effusion, not elsewhere classified: Secondary | ICD-10-CM | POA: Insufficient documentation

## 2010-07-30 DIAGNOSIS — K8020 Calculus of gallbladder without cholecystitis without obstruction: Secondary | ICD-10-CM | POA: Insufficient documentation

## 2010-07-30 DIAGNOSIS — L989 Disorder of the skin and subcutaneous tissue, unspecified: Secondary | ICD-10-CM | POA: Insufficient documentation

## 2010-07-30 DIAGNOSIS — Z79899 Other long term (current) drug therapy: Secondary | ICD-10-CM | POA: Insufficient documentation

## 2010-07-30 DIAGNOSIS — Z923 Personal history of irradiation: Secondary | ICD-10-CM | POA: Insufficient documentation

## 2010-07-30 DIAGNOSIS — Z9010 Acquired absence of unspecified breast and nipple: Secondary | ICD-10-CM | POA: Insufficient documentation

## 2010-07-30 HISTORY — DX: Malignant neoplasm of unspecified site of unspecified female breast: C50.919

## 2010-07-30 MED ORDER — TECHNETIUM TC 99M MEDRONATE IV KIT
25.0000 | PACK | Freq: Once | INTRAVENOUS | Status: AC | PRN
  Administered 2010-07-30: 23.5 via INTRAVENOUS

## 2010-07-30 MED ORDER — IOHEXOL 300 MG/ML  SOLN
100.0000 mL | Freq: Once | INTRAMUSCULAR | Status: AC | PRN
  Administered 2010-07-30: 100 mL via INTRAVENOUS

## 2010-08-25 ENCOUNTER — Ambulatory Visit: Payer: MEDICARE | Attending: Radiation Oncology | Admitting: Radiation Oncology

## 2010-09-09 NOTE — Discharge Summary (Signed)
Elizabeth, [name]                ACCOUNT NO.:  [id number]   MEDICAL RECORD NO.:  [id number]          PATIENT TYPE:  INP   LOCATION:  5705                         FACILITY:  [facility]   PHYSICIAN:  [name], [name]  DATE OF BIRTH:  1924/06/18   DATE OF ADMISSION:  01/31/2007  DATE OF DISCHARGE:  02/05/2007                               DISCHARGE SUMMARY   DISCHARGE DIAGNOSES:  1. Metastatic breast cancer.  2. History of breast mass secondary to #1.  3. Back pain, likely secondary to lytic lesions in the setting of #1.  4. Anemia of chronic disease.  5. Hypertension.   DISCHARGE MEDICATIONS:  1. Dexamethasone 4 mg by mouth four times daily.  2. OxyContin 20 mg by mouth q.12 hours.  3. Colace 100 mg by mouth twice daily.  4. Oxycodone 5 mg tablets, one to two tablets every 4 hours as needed      for pain.  5. Phenergan 12.5 mg by mouth every 4-6 hours as needed for nausea.   CONSULTATIONS:  1. Samul Dada, [name] of oncology was consulted.  2. Revonda Standard L. Rennis Harding, N.P. of palliative care was also consulted.   PROCEDURE:  1. A plain film of the abdomen on January 31, 2007, demonstrated right      pleural effusion with probable underlying right lower lobe      pneumonia.  Cholelithiasis was present.  2. A computed tomography scan of the abdomen with contrast on January 31, 2007, demonstrated a large right breast mass with right pleural      metastatic disease and probably right axillary metastatic disease.      Diffuse thoracic spinal and rib metastatic disease was present with      extensive bony destruction of the pedicle on the right side of the      T10 level and with evidence of extensive tumor and hemorrhage      involvement in the spinal canal at this level.  Scattered pulmonary      nodules were seen and were likely metastatic.  A 60% right sided      pleural effusion was present.  A smaller left breast lesion of      uncertain significance was seen.  Abdominal  findings also consisted      of osseous metastatic disease in the pelvis.  3. A magnetic resonance imaging study of the brain with and without      contrast on January 31, 2007, demonstrated mild dural enhancement.      An obstructing mass of the left nasal cavity with chronic      obstruction of the left maxillary sinus was seen.  Three discrete      enhancing skin nodules of the left scalp were seen.  4. A nuclear medicine bone scan on February 01, 2007, demonstrated no      evidence of metastatic skeletal disease.  A ptotic right kidney was      seen.  5. A magnetic resonance imaging study of the thoracic spine with and      without contrast  on February 01, 2007, demonstrated malignant severe      spinal cord compression at T10 related to diffuse epidural tumor.      Associated spinal cord edema was seen.  Diffuse osseous metastatic      disease with multiple levels of extraosseous tumor extending into      the epidural space, but no other levels of cord compression were      seen.  Hepatic metastatic disease was suspected from this study.  6. Bilateral breast ultrasound on February 02, 2007, demonstrated a      large ulcerating mass replacing the entire breast, compatible with      malignancy.  Enlarged right axillary lymph nodes compatible with      lymphatic spread were seen.  An indeterminate left subareolar      breast lesion was seen and felt to be suspicious for malignancy.      Final assessment was BI-RADS 5.  A core biopsy was subsequently      performed with pathology demonstrating malignant invasive mammary      carcinoma.   ADMISSION HISTORY:  The patient is a 77 year old woman with a somewhat  limited available past medical history most significant for glaucoma who  did not have normal physician follow-up.  She came to the emergency  department on January 31, 2007, complaining of low back pain.  The  patient endorsed intermittent back pain and abdominal pain for six  months,  but has not followed with a physician in approximately 10 years.  She woke up the morning of admission in severe pain and thus came to the  emergency department.  She endorses a 12-16 pound weight loss in the  past six months prior to admission and progressively enlarging right  breast mass.  She cannot explain why she did not see a physician sooner  for these symptoms.  She denied fevers, chills, melena, nausea and  vomiting, diarrhea, or hemoptysis.  She endorses generalized fatigue,  malaise, and anorexia in addition to previously mentioned symptoms.   ADMISSION PHYSICAL EXAMINATION:  VITAL SIGNS:  Temperature 96.7 degrees  Fahrenheit, blood pressure 176/87, pulse 90, respiratory rate 20, oxygen  saturation 96% on room air.  GENERAL:  No acute distress.  Elderly frail woman, cachectic appearing.  HEENT:  Right pupil 1 mm with left pupil 4 mm in diameter and  nonreactive.  Extraocular movements intact.  Oropharynx clear.  Dry  mucous membranes.  NECK:  No adenopathy.  Supple.  LUNGS:  Decreased breath sounds in the right base with some crackles.  Scattered crackles in the left mid lung.  No wheezes or rhonchi.  CARDIOVASCULAR:  Regular rate and rhythm with no murmurs, rubs, or  gallops.  BREASTS:  Right breast markedly decreased in size and mass.  Diffusely  red, but without frank bleeding.  Areas of old blood are seen.  Left  axilla demonstrates a hard node approximately 1 cm in diameter.  No  adenopathy in the right axilla.  EXTREMITIES:  No edema.  LYMPH:  As above.  NEUROLOGY:  Alert and oriented x3.  Cranial nerves II-XII grossly intact  except for pupil examination as above.  Strength 5/5 in all extremities.  Sensation grossly intact.  Gait not assessed.  PSYCH:  Flat affect.   ADMISSION LABS:  Sodium 139, potassium 3.5, chloride 102, bicarbonate  28, BUN 13, creatinine 0.8, glucose 136, calcium 9.3, bilirubin 0.9,  alkaline phosphatase 100, AST 35, ALT 15, total protein  7.4, albumin  3.5.  White blood cell count 5.2, ANC 2.8, hemoglobin 10.2, platelet  count 228, MCV 90.  Urinalysis was unremarkable.  An anemia panel  demonstrated an iron level of 75 mcg/dL, TIBC 161 for percent saturation  of 24%, normal vitamin B12 level and a ferritin that was elevated at  307.  Total blood LDH was normal at 203.  Cancer antigen 27.29 was  markedly elevated at 2009 units per mL.  CEA was elevated at 152.4.  Blood cultures were drawn and were negative at five days.   HOSPITAL COURSE:  Problem 1.  Metastatic breast cancer.  The patient's  presenting complaint was low back pain, but she also noted breast  lesion.  From the start this was felt to be malignancy with metastatic  disease given the grossly abnormal breast examination and numerous  findings consistent with osseous metastatic disease.  [facility] was subsequently consulted and they felt that surgical  intervention was not appropriate given end stage of disease.  A core  biopsy was recommended.  Medical oncology was subsequently consulted and  we appreciate Dr. Mamie Levers assistance in this regard.  Ultimately, it  was decided after discussion with medical oncology, radiation oncology,  and surgery that the patient would undergo palliative x-ray therapy  primarily to the thoracic spine to alleviate pain.  She was empirically  started on Decadron for spinal cord edema and was subsequently  discharged home per the family's wishes to begin outpatient palliative  radiotherapy.  As the extent of this disease was so significant, this  was felt to be end-stage and only palliation would be indicated.   Problem 2.  Back pain.  See problem #1.  X-ray therapy was indicated for  palliation, with pain managed with opioids as needed.   Problem 3.  Anemia.  Likely of chronic disease, consistent with ongoing  metastatic breast cancer.   Problem 4.  History of glaucoma.  The patient's glaucoma medicines were   continued during this hospitalization.   DISCHARGE LABORATORY DATA:  Sodium 136, potassium 4.2, chloride 100,  bicarbonate 26, BUN 16, creatinine 0.91, glucose 143, calcium 9.2.  White blood cell count 7.9, hemoglobin 9.6 and stable, hematocrit 29,  platelet count 224.  Phosphorus was normal at 4.2 and magnesium was  normal at 2.4.   DISCHARGE VITAL SIGNS:  Temperature 98.8 degrees Fahrenheit, blood  pressure 153/72, pulse 68, respirations 14, oxygen saturation 98% on  room air.   DISPOSITION:  The patient was discharged on February 05, 2007, to home  with home health for PT, OT, and skilled nursing via [facility].  The contact for [facility] is Thera Flake at [phone].  The  patient was scheduled to follow up with Samul Dada, [name] at [phone].  She was informed that she would be called with an appointment  time.      Madelaine Etienne, MD  Electronically Signed      [name], [name]  Electronically Signed    JH/MEDQ  D:  07/05/2007  T:  07/05/2007  Job:  [id number]

## 2010-09-09 NOTE — Consult Note (Signed)
Elizabeth [name], Elizabeth [name]                ACCOUNT NO.:  [id number]   MEDICAL RECORD NO.:  [id number]          PATIENT TYPE:  INP   LOCATION:  5705                         FACILITY:  [facility]   PHYSICIAN:  Revonda Standard L. Rennis Harding, N.P. DATE OF BIRTH:  1924-05-14   DATE OF CONSULTATION:  01/31/2007  DATE OF DISCHARGE:                                 CONSULTATION   REASON FOR CONSULTATION:  Suspected right breast cancer.   HISTORY OF PRESENT ILLNESS:  Ms. Elizabeth [name] is a 77 year old female patient  with only reported past history of glaucoma, apparently does not follow  up with a physician regularly.  She came to the ER because of  experiencing low back pain.  Her white cell count was normal.  Hemoglobin was 10.2.  No reported history of anemia in the past.  On  clinical exam, she was found to have a very large, hard, nontender mass  in the space where the right breast should be, the right breast is  actually totally obliterated with no anatomical features consistent with  the breast.  The patient denies prior history of mastectomy.  The  patient underwent chest x-ray and abdominal x-rays.  The chest x-ray  shows what was read as a right lower lobe pneumonia, but when I looked  at the film this looked more consistent with a significant pleural  effusion.  In addition, she has extensive osteopenia involving the rib  cage.  Abdominal films showed no acute process.  She did have maybe some  rectal obstipation and a gallbladder full a very large gallstones.  Surgical evaluation has been requested regarding potential biopsies of  the breast mass.   REVIEW OF SYSTEMS:  As above.  The patient does not report any weight  loss to me.  She states that this area has been growing on her breast  for about 6 months and she has not had any mammograms in the past  several years.  She has had no fever, no chills, no cough.  She has had  increasing lower back pain.  She has had no bleeding or drainage from  the mass,  except when she accidentally traumatized it by scratching it  because it was itchy.   FAMILY HISTORY:  She does have a daughter who is with her today who has  a history of prior breast cancer as well as an excision of a skin  cancer.   SOCIAL HISTORY:  No alcohol.  No tobacco.  She lives alone.   PAST MEDICAL HISTORY:  Glaucoma.   PAST SURGICAL HISTORY:  The patient denies.   ALLERGIES:  NKDA.   CURRENT MEDICATIONS:  Eye drops for glaucoma.   PHYSICAL EXAMINATION:  GENERAL:  Pleasant female patient complaining of  low back pain.  VITAL SIGNS:  Temperature 97.5, BP 160/80, pulse 79, respirations 20.  NEUROLOGICAL:  The patient is alert and oriented x3, moving all  extremities x4 without focal deficits.  HEENT:  Head normocephalic, sclera injected.  NECK:  Supple.  No adenopathy.  CHEST:  Bilateral lung sounds clear to auscultation.  She is diminished  in  the right base.  She is experiencing no tachypnea, no shortness of  breath.  She is on room air.  CARDIAC:  S1-S2 without rubs, murmurs, thrills or gallops.  ABDOMEN:  Soft, nontender, nondistended without hepatosplenomegaly,  masses or bruits.  EXTREMITIES:  Symmetrical in appearance without edema, cyanosis or  clubbing.  BREAST:  The right breast is completely obliterated by a firm, nodular,  irregular surface mass.  Again, there is no anatomy consistent with a  normal appearance of the breast.  This area is nontender.  There is no  exudate.  There are areas of excoriation from where the patient reports  scratching because of itching.  Estimated size of this is about 5 x 6  cm.  LYMPHATICS:  I am unable to appreciate any super infraclavicular lymph  nodes.  She does have a deep right axillary firmness consistent with  either an enlarged lymph node versus an extension of the mass into the  tail of the breast deep in the right axilla, she also has small mobile  lymph nodes in the left axilla, and she appears to have bilateral  soft,  mobile lymph nodes in the groin.  EXTREMITIES:  Symmetrical in appearance without edema, cyanosis or  clubbing.   LABORATORY DATA:  Urinalysis is negative.  White count 5200, hemoglobin  10.2, platelets 228,000.  Sodium 139, potassium 3.5, CO2 28, glucose  136, BUN 13, creatinine 0.78.  LFTs are normal.   DIAGNOSTICS:  Chest x-ray and abdominal x-ray as noted in history  present illness.   CT of the chest, abdomen and pelvis as well as an MRI of the brain are  pending.   IMPRESSION:  1. Large invasive breast mass, most likely end-stage breast cancer.  2. Right pleural effusion, probably malignant etiology.  3. Osteopenia on chest x-ray, probable bone metastasis versus      osteoporosis.  4. Back pain probably secondary to bone metastasis.   PLAN:  1. Agree with [facility]'s current workup.  2. At this stage, uncertain if any treatment is indicated.  I will      discuss with Dr. Colin Benton if and when breast biopsy will be done.  3. May wish to consult Oncology first before pursuing a biopsy in the      event this is not really indicated given the advanced stage of this      mass and suspected metastatic disease.  Will follow up on the      surveillance CTs an MRI as ordered by [facility].  4. Very important to note that the patient's family/daughter is      unaware of this diagnosis.  The patient told her daughter she is in      the hospital with a diagnosis of pneumonia.  5. I did discuss with the patient the importance of being honest with      her family since at this point she looks like this mass is related      to cancer, and given clinical exam, this is most consistent with an      end-stage untreatable process.      Allison L. Rennis Harding, N.P.     ALE/MEDQ  D:  01/31/2007  T:  01/31/2007  Job:  [id number]

## 2010-09-09 NOTE — Consult Note (Signed)
Elizabeth [name], [name]                ACCOUNT NO.:  [id number]   MEDICAL RECORD NO.:  [id number]          PATIENT TYPE:  INP   LOCATION:  5705                         FACILITY:  [facility]   PHYSICIAN:  Samul Dada, M.D.DATE OF BIRTH:  21-Aug-1924   DATE OF CONSULTATION:  02/02/2007  DATE OF DISCHARGE:                                 CONSULTATION   REASON FOR CONSULTATION:  Breast cancer.   HISTORY OF PRESENT ILLNESS:  Elizabeth [name] is a 77 year old white female  admitted with low back pain and right pneumonia on January 31, 2007.  It  is important to mention that the patient has not seen a primary care  physician in about 10 years.  Upon clinical exam, she was found to have  a large right fungating, hard, nontender mass completely replacing the  breast tissue.  The patient states that this mass has been increasingly  larger for the last six months, but she failed to tell her family about  it and did not seek medical attention.  Surgery evaluated the patient  for possible total mastectomy.  CT of the chest, abdomen and pelvis were  performed on October 6, confirming this large right breast mass  associated with right pleural metastatic disease and probably right  axillary metastatic disease.  Diffuse thoracic spinal and rib metastases  with extensive bony destruction of the right pedicle at T10 level were  seen as well as invasion of spinal canal.  Probable pulmonary nodules.  There is a moderate right pleural effusion and a smaller left breast  lesion.  There is diffuse lumbar metastatic disease and pelvic  metastatic disease.  MRI of the brain and bone scans are pending.   PAST MEDICAL HISTORY:  None.   PAST SURGICAL HISTORY:  None.   ALLERGIES:  NKDA.   MEDICATIONS:  Current medications:  Lumigan, Alphagan, Decadron,  Trusopt, Lovenox, OxyContin, pilocarpine, Senokot, Timolol, Benadryl,  Narcan, sulfa, hydroxycodone, Compazine.   REVIEW OF SYSTEMS:  Remarkable for fatigue,  12-to-16-pound weight loss  in the last six months, increased appetite.  Dyspnea on exertion.  Constipation.  Back pain, especially at the right costal area for the  last two months.  The rest of the review of systems is negative.  History of the breast mass as above.   FAMILY HISTORY:  Noncontributory for cancer.   SOCIAL HISTORY:  The patient is widowed.  She has one daughter who lives  nearby.  It is important to mention that the patient still did not let  her daughter know about these findings.  According to her, she told her  daughter that she has been admitted for pneumonia.  She is retired.  No  alcohol or tobacco history.   PHYSICAL EXAMINATION:  GENERAL:  This is a cachectic 77 year old white  female in no acute distress.  Alert and oriented times three.  VITAL SIGNS:  Blood pressure 122/61.  Pulse of 76.  Respirations 18.  Temperature of 99.4, T-max.  Pulse oximetry 98% on room air.  HEENT:  Normocephalic, atraumatic.  There is a right pupil much smaller  than the  left.  Oral mucosa without thrush or lesions.  Neck supple, no  cervical or supraclavicular masses.  LUNGS:  With decreased breath sounds on the right and rales on the left.  There is a right axillary lymph node versus extension of the mass and a  small left node palpable.  BREASTS:  The left with a possible left lesion, but the right breast has  been completely replaced by the fungating mass with some crusting, no  open lesions or odor.  CARDIOVASCULAR:  Regular rate and rhythm without murmurs, rubs or  gallops.  ABDOMEN:  Soft, nontender.  Bowel sounds present.  No palpable spleen or  liver.  GENITAL/RECTAL:  Deferred.  EXTREMITIES:  Without clubbing or cyanosis.  No edema.  SKIN:  Without lesions, bruising or petechiae with the exception of the  described fungating mass visible in the exam.  NEUROLOGIC:  Nonfocal.   LABORATORY DATA:  Hemoglobin 10.2, hematocrit 31.2, white count 5.2,  platelets 228,000,  neutrophils 2.8, MCV 98.1, retic count 5.1.  Iron 75,  TIBC 316, percent saturation 24, ferritin 307, folic acid greater than  20.  Sodium 139, potassium 3.5, BUN 13, creatinine 0.78, glucose 136,  total bilirubin 0.9, alkaline phosphatase 100, AST 35, ALT 15, total  protein 7.4, albumin 3.5, calcium 9.3.   ASSESSMENT/PLAN:  Dr. Kimberlee Nearing has seen and evaluated the patient  and reviewed the chart.  This is a 77 year old white female with  widespread metastatic breast cancer by CT scans and physical  examination.  The patient relates a six-month history of right breast  mass, obviously neglected without ongoing medical care for at least 10  years.  The right breast mass is fixed to the chest wall and has eroded  skin, little exudate or bleeding or odor.  The mass measures 6 x 8 cm,  no palpable lymph nodes.  The breast mass is believed to be essentially  unresectable.  The patient has trapped right pleural effusion, extensive  bone disease, right lung involvement and impending cord compression at  the T10.  She needs breast biopsy, the pathology including receptors as  well as HER-2/neu status, bone scan to look for impending pathologic  fracture, MRI of the thoracic spine and tumor markers.  Would not favor  attempt at mastectomy, but the patient will probably need radiation to  the T10 area with possible Decadron and appropriate systemic therapy  consisting of chemotherapy, hormonal or Herceptin if her 2/neu is  positive.   We will try to get the patient on oral pain regimen with PCA.  No  clinical signs of cord compression at this time.   Thank you very much for allowing [place] the opportunity to participate in  the care of Elizabeth [name].  We will follow the results for further  recommendations.      Marlowe Kays, P.A.      Samul Dada, [name]  Electronically Signed    SW/MEDQ  D:  02/02/2007  T:  02/02/2007  Job:  [id number]

## 2010-09-18 ENCOUNTER — Encounter (HOSPITAL_BASED_OUTPATIENT_CLINIC_OR_DEPARTMENT_OTHER): Payer: Medicare Other | Admitting: Oncology

## 2010-09-18 ENCOUNTER — Other Ambulatory Visit (HOSPITAL_COMMUNITY): Payer: Self-pay | Admitting: Oncology

## 2010-09-18 DIAGNOSIS — C50519 Malignant neoplasm of lower-outer quadrant of unspecified female breast: Secondary | ICD-10-CM

## 2010-09-18 DIAGNOSIS — C50919 Malignant neoplasm of unspecified site of unspecified female breast: Secondary | ICD-10-CM

## 2010-09-18 DIAGNOSIS — C7951 Secondary malignant neoplasm of bone: Secondary | ICD-10-CM

## 2010-09-18 LAB — CBC WITH DIFFERENTIAL/PLATELET
Basophils Absolute: 0 10*3/uL (ref 0.0–0.1)
Eosinophils Absolute: 0.1 10*3/uL (ref 0.0–0.5)
HGB: 12.8 g/dL (ref 11.6–15.9)
MCV: 87.4 fL (ref 79.5–101.0)
MONO#: 0.6 10*3/uL (ref 0.1–0.9)
MONO%: 8.8 % (ref 0.0–14.0)
NEUT#: 4.9 10*3/uL (ref 1.5–6.5)
Platelets: 208 10*3/uL (ref 145–400)
RBC: 4.45 10*6/uL (ref 3.70–5.45)
RDW: 14.3 % (ref 11.2–14.5)
WBC: 6.9 10*3/uL (ref 3.9–10.3)
nRBC: 0 % (ref 0–0)

## 2010-09-18 LAB — COMPREHENSIVE METABOLIC PANEL
ALT: 9 U/L (ref 0–35)
Albumin: 4.2 g/dL (ref 3.5–5.2)
CO2: 23 mEq/L (ref 19–32)
Calcium: 9.4 mg/dL (ref 8.4–10.5)
Chloride: 107 mEq/L (ref 96–112)
Glucose, Bld: 91 mg/dL (ref 70–99)
Sodium: 144 mEq/L (ref 135–145)
Total Bilirubin: 0.9 mg/dL (ref 0.3–1.2)
Total Protein: 6.8 g/dL (ref 6.0–8.3)

## 2010-09-18 LAB — LACTATE DEHYDROGENASE: LDH: 172 U/L (ref 94–250)

## 2010-10-13 ENCOUNTER — Other Ambulatory Visit: Payer: Self-pay | Admitting: Family Medicine

## 2010-11-18 ENCOUNTER — Other Ambulatory Visit (HOSPITAL_COMMUNITY): Payer: Self-pay | Admitting: Oncology

## 2010-11-18 ENCOUNTER — Encounter (HOSPITAL_BASED_OUTPATIENT_CLINIC_OR_DEPARTMENT_OTHER): Payer: Medicare Other | Admitting: Oncology

## 2010-11-18 DIAGNOSIS — C50519 Malignant neoplasm of lower-outer quadrant of unspecified female breast: Secondary | ICD-10-CM

## 2010-11-18 DIAGNOSIS — C50919 Malignant neoplasm of unspecified site of unspecified female breast: Secondary | ICD-10-CM

## 2010-11-18 DIAGNOSIS — C7952 Secondary malignant neoplasm of bone marrow: Secondary | ICD-10-CM

## 2010-11-18 LAB — COMPREHENSIVE METABOLIC PANEL
BUN: 12 mg/dL (ref 6–23)
CO2: 27 mEq/L (ref 19–32)
Glucose, Bld: 94 mg/dL (ref 70–99)
Sodium: 142 mEq/L (ref 135–145)
Total Bilirubin: 1.2 mg/dL (ref 0.3–1.2)
Total Protein: 7.4 g/dL (ref 6.0–8.3)

## 2010-11-18 LAB — CBC WITH DIFFERENTIAL/PLATELET
Basophils Absolute: 0 10*3/uL (ref 0.0–0.1)
Eosinophils Absolute: 0.2 10*3/uL (ref 0.0–0.5)
HCT: 38.1 % (ref 34.8–46.6)
HGB: 12.7 g/dL (ref 11.6–15.9)
LYMPH%: 21.8 % (ref 14.0–49.7)
MCV: 86.6 fL (ref 79.5–101.0)
MONO#: 0.6 10*3/uL (ref 0.1–0.9)
NEUT#: 4.4 10*3/uL (ref 1.5–6.5)
NEUT%: 66.8 % (ref 38.4–76.8)
Platelets: 217 10*3/uL (ref 145–400)
RBC: 4.4 10*6/uL (ref 3.70–5.45)
WBC: 6.6 10*3/uL (ref 3.9–10.3)

## 2010-11-18 LAB — CANCER ANTIGEN 27.29: CA 27.29: 181 U/mL — ABNORMAL HIGH (ref 0–39)

## 2010-11-24 ENCOUNTER — Ambulatory Visit
Admission: RE | Admit: 2010-11-24 | Discharge: 2010-11-24 | Disposition: A | Discharge status: Home / Self Care | Payer: Medicare Other | Source: Ambulatory Visit | Attending: Radiation Oncology | Admitting: Radiation Oncology

## 2010-11-24 DIAGNOSIS — L98499 Non-pressure chronic ulcer of skin of other sites with unspecified severity: Secondary | ICD-10-CM | POA: Insufficient documentation

## 2010-11-24 DIAGNOSIS — C50919 Malignant neoplasm of unspecified site of unspecified female breast: Secondary | ICD-10-CM | POA: Insufficient documentation

## 2010-12-02 ENCOUNTER — Encounter (INDEPENDENT_AMBULATORY_CARE_PROVIDER_SITE_OTHER): Payer: Self-pay | Admitting: General Surgery

## 2010-12-02 ENCOUNTER — Ambulatory Visit (INDEPENDENT_AMBULATORY_CARE_PROVIDER_SITE_OTHER): Payer: Medicare Other | Admitting: General Surgery

## 2010-12-02 VITALS — BP 132/86 | HR 68 | Temp 97.8°F | Ht 64.0 in | Wt 109.1 lb

## 2010-12-02 DIAGNOSIS — C50919 Malignant neoplasm of unspecified site of unspecified female breast: Secondary | ICD-10-CM

## 2010-12-02 DIAGNOSIS — C7952 Secondary malignant neoplasm of bone marrow: Secondary | ICD-10-CM

## 2010-12-02 NOTE — Patient Instructions (Signed)
Continue follow up with Dr. Arline Asp and Roselind Messier

## 2010-12-11 NOTE — Progress Notes (Signed)
Subjective:     Patient ID: Elizabeth [name], female   DOB: November 25, 1924, 77 y.o.   MRN: [id number]  HPI The patient is an 77 year old white female who was diagnosed with metastatic right breast cancer in 2008. Since then she has apparently been treated with antiestrogen therapy and radiation. According to the oncologist it does not appear as though the disease has progressed much during that time. She presents now because she has been scratching a spot on her right chest wall for the last 2 months and this area has developed an ulceration.  Review of Systems  Constitutional: Positive for appetite change and fatigue.  HENT: Negative.   Eyes: Negative.   Respiratory: Negative.   Cardiovascular: Negative.   Gastrointestinal: Negative.   Genitourinary: Negative.   Musculoskeletal: Negative.   Skin: Positive for wound.  Neurological: Negative.   Hematological: Negative.   Psychiatric/Behavioral: Negative.        Objective:   Physical Exam  Constitutional: She is oriented to person, place, and time. She appears well-developed.       A thin elderly white female  HENT:  Head: Normocephalic and atraumatic.  Eyes: Conjunctivae and EOM are normal. Pupils are equal, round, and reactive to light.  Neck: Normal range of motion. Neck supple.  Cardiovascular: Normal rate, regular rhythm and normal heart sounds.   Pulmonary/Chest: Effort normal and breath sounds normal.       The patient has a large nodular mass that has almost auto amputated the right breast.There is about a quarter-sized ulcer in the center of the mass  Abdominal: Soft. Bowel sounds are normal.  Musculoskeletal: Normal range of motion.  Neurological: She is alert and oriented to person, place, and time.  Skin: Skin is warm and dry.  Psychiatric: She has a normal mood and affect. Her behavior is normal.       Assessment:     This elderly lady has metastatic right breast cancer. The area on the right chest wall looks very  much like a large area of breast cancer that is eating away the right breast.    Plan:     The patient is sent here to talk about the possibility of biopsying this mass in order to get more treatment. In my opinion this looks very much like a very advanced cancer of the right breast. I am not sure that time to biopsy it will add anything to the diagnosis. I have discussed this with Dr. Arline Asp and Dr. Roselind Messier and they will reevaluate her and let us know if the biopsy is to really necessary.

## 2010-12-22 ENCOUNTER — Encounter (HOSPITAL_BASED_OUTPATIENT_CLINIC_OR_DEPARTMENT_OTHER): Payer: Medicare Other | Admitting: Oncology

## 2010-12-22 ENCOUNTER — Other Ambulatory Visit (HOSPITAL_COMMUNITY): Payer: Self-pay | Admitting: Oncology

## 2010-12-22 DIAGNOSIS — C7951 Secondary malignant neoplasm of bone: Secondary | ICD-10-CM

## 2010-12-22 DIAGNOSIS — C50919 Malignant neoplasm of unspecified site of unspecified female breast: Secondary | ICD-10-CM

## 2010-12-22 DIAGNOSIS — C50519 Malignant neoplasm of lower-outer quadrant of unspecified female breast: Secondary | ICD-10-CM

## 2010-12-22 DIAGNOSIS — C7952 Secondary malignant neoplasm of bone marrow: Secondary | ICD-10-CM

## 2010-12-22 LAB — CEA: CEA: 28.7 ng/mL — ABNORMAL HIGH (ref 0.0–5.0)

## 2010-12-22 LAB — CBC WITH DIFFERENTIAL/PLATELET
BASO%: 0.2 % (ref 0.0–2.0)
Eosinophils Absolute: 0.1 10*3/uL (ref 0.0–0.5)
HCT: 37.6 % (ref 34.8–46.6)
LYMPH%: 19.1 % (ref 14.0–49.7)
MCHC: 34.1 g/dL (ref 31.5–36.0)
MCV: 87.4 fL (ref 79.5–101.0)
MONO#: 0.5 10*3/uL (ref 0.1–0.9)
NEUT%: 70.2 % (ref 38.4–76.8)
Platelets: 228 10*3/uL (ref 145–400)
WBC: 6.1 10*3/uL (ref 3.9–10.3)

## 2010-12-22 LAB — COMPREHENSIVE METABOLIC PANEL
CO2: 25 mEq/L (ref 19–32)
Creatinine, Ser: 0.9 mg/dL (ref 0.50–1.10)
Glucose, Bld: 99 mg/dL (ref 70–99)
Total Bilirubin: 0.8 mg/dL (ref 0.3–1.2)

## 2010-12-22 LAB — LACTATE DEHYDROGENASE: LDH: 179 U/L (ref 94–250)

## 2010-12-25 ENCOUNTER — Other Ambulatory Visit (HOSPITAL_COMMUNITY): Payer: Self-pay | Admitting: Oncology

## 2010-12-25 DIAGNOSIS — C799 Secondary malignant neoplasm of unspecified site: Secondary | ICD-10-CM

## 2010-12-25 DIAGNOSIS — C50919 Malignant neoplasm of unspecified site of unspecified female breast: Secondary | ICD-10-CM

## 2011-01-06 DIAGNOSIS — K08409 Partial loss of teeth, unspecified cause, unspecified class: Secondary | ICD-10-CM

## 2011-01-06 DIAGNOSIS — K0530 Chronic periodontitis, unspecified: Secondary | ICD-10-CM

## 2011-01-06 DIAGNOSIS — K08109 Complete loss of teeth, unspecified cause, unspecified class: Secondary | ICD-10-CM

## 2011-01-13 ENCOUNTER — Ambulatory Visit (HOSPITAL_COMMUNITY)
Admission: RE | Admit: 2011-01-13 | Discharge: 2011-01-13 | Disposition: A | Discharge status: Home / Self Care | Payer: Medicare Other | Source: Ambulatory Visit | Attending: Oncology | Admitting: Oncology

## 2011-01-13 DIAGNOSIS — C7952 Secondary malignant neoplasm of bone marrow: Secondary | ICD-10-CM | POA: Insufficient documentation

## 2011-01-13 DIAGNOSIS — C799 Secondary malignant neoplasm of unspecified site: Secondary | ICD-10-CM

## 2011-01-13 DIAGNOSIS — C7951 Secondary malignant neoplasm of bone: Secondary | ICD-10-CM | POA: Insufficient documentation

## 2011-01-13 DIAGNOSIS — J984 Other disorders of lung: Secondary | ICD-10-CM | POA: Insufficient documentation

## 2011-01-13 DIAGNOSIS — C50919 Malignant neoplasm of unspecified site of unspecified female breast: Secondary | ICD-10-CM | POA: Insufficient documentation

## 2011-01-13 DIAGNOSIS — K7689 Other specified diseases of liver: Secondary | ICD-10-CM | POA: Insufficient documentation

## 2011-01-13 MED ORDER — FLUDEOXYGLUCOSE F - 18 (FDG) INJECTION
16.5000 | Freq: Once | INTRAVENOUS | Status: AC | PRN
  Administered 2011-01-13: 16.5 via INTRAVENOUS

## 2011-01-20 ENCOUNTER — Other Ambulatory Visit (HOSPITAL_COMMUNITY): Payer: Self-pay | Admitting: Oncology

## 2011-01-20 ENCOUNTER — Encounter (HOSPITAL_BASED_OUTPATIENT_CLINIC_OR_DEPARTMENT_OTHER): Payer: Medicare Other | Admitting: Oncology

## 2011-01-20 DIAGNOSIS — C7951 Secondary malignant neoplasm of bone: Secondary | ICD-10-CM

## 2011-01-20 DIAGNOSIS — C7952 Secondary malignant neoplasm of bone marrow: Secondary | ICD-10-CM

## 2011-01-20 DIAGNOSIS — C50919 Malignant neoplasm of unspecified site of unspecified female breast: Secondary | ICD-10-CM

## 2011-01-20 DIAGNOSIS — C50519 Malignant neoplasm of lower-outer quadrant of unspecified female breast: Secondary | ICD-10-CM

## 2011-01-20 LAB — CBC WITH DIFFERENTIAL/PLATELET
Basophils Absolute: 0 10*3/uL (ref 0.0–0.1)
Eosinophils Absolute: 0.1 10*3/uL (ref 0.0–0.5)
HGB: 13.1 g/dL (ref 11.6–15.9)
LYMPH%: 19.4 % (ref 14.0–49.7)
MCV: 87.6 fL (ref 79.5–101.0)
MONO#: 0.7 10*3/uL (ref 0.1–0.9)
NEUT#: 4.2 10*3/uL (ref 1.5–6.5)
Platelets: 194 10*3/uL (ref 145–400)
RBC: 4.53 10*6/uL (ref 3.70–5.45)
RDW: 14.6 % — ABNORMAL HIGH (ref 11.2–14.5)
WBC: 6.3 10*3/uL (ref 3.9–10.3)
nRBC: 0 % (ref 0–0)

## 2011-01-20 LAB — COMPREHENSIVE METABOLIC PANEL
ALT: 11 U/L (ref 0–35)
AST: 27 U/L (ref 0–37)
Albumin: 4.2 g/dL (ref 3.5–5.2)
CO2: 26 mEq/L (ref 19–32)
Calcium: 8.9 mg/dL (ref 8.4–10.5)
Chloride: 106 mEq/L (ref 96–112)
Potassium: 4.2 mEq/L (ref 3.5–5.3)

## 2011-01-20 LAB — LACTATE DEHYDROGENASE: LDH: 246 U/L (ref 94–250)

## 2011-01-20 LAB — CEA: CEA: 29.1 ng/mL — ABNORMAL HIGH (ref 0.0–5.0)

## 2011-02-05 LAB — CBC
HCT: 31.2 — ABNORMAL LOW
Hemoglobin: 10.2 — ABNORMAL LOW
Hemoglobin: 9.1 — ABNORMAL LOW
Hemoglobin: 9.4 — ABNORMAL LOW
MCHC: 32.7
MCHC: 33.2
MCHC: 33.5
MCV: 90.1
Platelets: 203
Platelets: 224
Platelets: 228
RBC: 3.03 — ABNORMAL LOW
RBC: 3.18 — ABNORMAL LOW
RBC: 3.46 — ABNORMAL LOW
RDW: 16.8 — ABNORMAL HIGH
RDW: 17 — ABNORMAL HIGH
RDW: 17.2 — ABNORMAL HIGH
WBC: 5.2
WBC: 7.9

## 2011-02-05 LAB — COMPREHENSIVE METABOLIC PANEL
ALT: 14
ALT: 15
AST: 35
Albumin: 3 — ABNORMAL LOW
Albumin: 3.5
Alkaline Phosphatase: 100
Alkaline Phosphatase: 73
BUN: 13
CO2: 28
Calcium: 9.1
Calcium: 9.3
Chloride: 102
Creatinine, Ser: 0.78
GFR calc Af Amer: >60
GFR calc Af Amer: >60
GFR calc non Af Amer: >60
Glucose, Bld: 136 — ABNORMAL HIGH
Glucose, Bld: 151 — ABNORMAL HIGH
Potassium: 3.5
Potassium: 4.1
Sodium: 137
Sodium: 139
Total Bilirubin: 0.9
Total Protein: 6.7
Total Protein: 7.4

## 2011-02-05 LAB — DIFFERENTIAL
Basophils Absolute: 0.1
Basophils Relative: 1
Basophils Relative: 2 — ABNORMAL HIGH
Eosinophils Absolute: 0
Eosinophils Absolute: 0.2
Eosinophils Relative: 3
Lymphocytes Relative: 32
Lymphs Abs: 1.3
Lymphs Abs: 1.7
Monocytes Absolute: 0.3
Monocytes Absolute: 0.6
Monocytes Relative: 11
Monocytes Relative: 5
Neutro Abs: 2.8
Neutrophils Relative %: 53
Neutrophils Relative %: 70

## 2011-02-05 LAB — BASIC METABOLIC PANEL
BUN: 16
CO2: 27
Calcium: 8.9
Chloride: 100
Creatinine, Ser: 0.91
Creatinine, Ser: 0.96
GFR calc Af Amer: >60
GFR calc Af Amer: >60
GFR calc non Af Amer: 56 — ABNORMAL LOW
GFR calc non Af Amer: 60 — ABNORMAL LOW
Glucose, Bld: 132 — ABNORMAL HIGH
Sodium: 139

## 2011-02-05 LAB — IRON AND TIBC
Iron: 75
UIBC: 241

## 2011-02-05 LAB — CULTURE, BLOOD (ROUTINE X 2)

## 2011-02-05 LAB — RETICULOCYTES
RBC.: 3.22 — ABNORMAL LOW
Retic Count, Absolute: 45.1
Retic Ct Pct: 1.4

## 2011-02-05 LAB — CEA: CEA: 152.4 — ABNORMAL HIGH

## 2011-02-05 LAB — URINALYSIS, ROUTINE W REFLEX MICROSCOPIC
Glucose, UA: NEGATIVE
Hgb urine dipstick: NEGATIVE
Nitrite: NEGATIVE
Urobilinogen, UA: 0.2

## 2011-02-05 LAB — LACTATE DEHYDROGENASE: LDH: 203

## 2011-02-05 LAB — VITAMIN B12: Vitamin B-12: 287 (ref 211–911)

## 2011-02-05 LAB — CANCER ANTIGEN 27.29: CA 27.29: 2009 — ABNORMAL HIGH

## 2011-02-05 LAB — MAGNESIUM
Magnesium: 2.2
Magnesium: 2.4

## 2011-02-05 LAB — PHOSPHORUS: Phosphorus: 4.2

## 2011-02-09 ENCOUNTER — Other Ambulatory Visit: Payer: Self-pay | Admitting: Family Medicine

## 2011-02-19 ENCOUNTER — Encounter (HOSPITAL_BASED_OUTPATIENT_CLINIC_OR_DEPARTMENT_OTHER): Payer: Medicare Other | Admitting: Oncology

## 2011-02-19 ENCOUNTER — Other Ambulatory Visit (HOSPITAL_COMMUNITY): Payer: Self-pay | Admitting: Oncology

## 2011-02-19 DIAGNOSIS — L98499 Non-pressure chronic ulcer of skin of other sites with unspecified severity: Secondary | ICD-10-CM

## 2011-02-19 DIAGNOSIS — Z9010 Acquired absence of unspecified breast and nipple: Secondary | ICD-10-CM

## 2011-02-19 DIAGNOSIS — C7952 Secondary malignant neoplasm of bone marrow: Secondary | ICD-10-CM

## 2011-02-19 DIAGNOSIS — C7951 Secondary malignant neoplasm of bone: Secondary | ICD-10-CM

## 2011-02-19 DIAGNOSIS — C50519 Malignant neoplasm of lower-outer quadrant of unspecified female breast: Secondary | ICD-10-CM

## 2011-02-19 DIAGNOSIS — C50919 Malignant neoplasm of unspecified site of unspecified female breast: Secondary | ICD-10-CM

## 2011-02-19 LAB — COMPREHENSIVE METABOLIC PANEL
ALT: 9 U/L (ref 0–35)
CO2: 23 mEq/L (ref 19–32)
Calcium: 9.4 mg/dL (ref 8.4–10.5)
Chloride: 106 mEq/L (ref 96–112)
Creatinine, Ser: 0.84 mg/dL (ref 0.50–1.10)
Glucose, Bld: 106 mg/dL — ABNORMAL HIGH (ref 70–99)
Total Bilirubin: 0.7 mg/dL (ref 0.3–1.2)

## 2011-02-19 LAB — CBC WITH DIFFERENTIAL/PLATELET
BASO%: 0.5 % (ref 0.0–2.0)
Basophils Absolute: 0 10*3/uL (ref 0.0–0.1)
Eosinophils Absolute: 0.1 10*3/uL (ref 0.0–0.5)
HCT: 36.2 % (ref 34.8–46.6)
HGB: 12.2 g/dL (ref 11.6–15.9)
LYMPH%: 20.5 % (ref 14.0–49.7)
MONO#: 0.5 10*3/uL (ref 0.1–0.9)
NEUT#: 3.8 10*3/uL (ref 1.5–6.5)
NEUT%: 68.6 % (ref 38.4–76.8)
Platelets: 200 10*3/uL (ref 145–400)
WBC: 5.5 10*3/uL (ref 3.9–10.3)
lymph#: 1.1 10*3/uL (ref 0.9–3.3)

## 2011-02-19 LAB — LACTATE DEHYDROGENASE: LDH: 186 U/L (ref 94–250)

## 2011-02-19 LAB — CANCER ANTIGEN 27.29: CA 27.29: 192 U/mL — ABNORMAL HIGH (ref 0–39)

## 2011-02-23 ENCOUNTER — Ambulatory Visit: Payer: Medicare Other | Admitting: Radiation Oncology

## 2011-02-26 ENCOUNTER — Telehealth: Payer: Self-pay | Admitting: Oncology

## 2011-02-26 NOTE — Telephone Encounter (Signed)
Moved 11/29 fu to RJ due to DM out. RJ ok per robin. S/w pt today re new time for 11/26 @ 2:15 pm.

## 2011-03-18 ENCOUNTER — Other Ambulatory Visit: Payer: Self-pay | Admitting: Oncology

## 2011-03-18 DIAGNOSIS — C50919 Malignant neoplasm of unspecified site of unspecified female breast: Secondary | ICD-10-CM

## 2011-03-20 ENCOUNTER — Telehealth: Payer: Self-pay | Admitting: *Deleted

## 2011-03-20 NOTE — Telephone Encounter (Signed)
patient's grandson called in and requested to move patient's appointment to an morning appointment patient's grandson confirmed the new date and time on 03-25-2011 starting at 11:15am

## 2011-03-23 ENCOUNTER — Other Ambulatory Visit: Payer: Self-pay | Admitting: Lab

## 2011-03-23 ENCOUNTER — Ambulatory Visit: Payer: Self-pay

## 2011-03-23 ENCOUNTER — Ambulatory Visit: Payer: Self-pay | Admitting: Physician Assistant

## 2011-03-24 ENCOUNTER — Other Ambulatory Visit: Payer: Self-pay | Admitting: Oncology

## 2011-03-25 ENCOUNTER — Ambulatory Visit (HOSPITAL_BASED_OUTPATIENT_CLINIC_OR_DEPARTMENT_OTHER): Payer: Medicare Other

## 2011-03-25 ENCOUNTER — Telehealth: Payer: Self-pay | Admitting: Oncology

## 2011-03-25 ENCOUNTER — Other Ambulatory Visit (HOSPITAL_BASED_OUTPATIENT_CLINIC_OR_DEPARTMENT_OTHER): Payer: Medicare Other | Admitting: Lab

## 2011-03-25 ENCOUNTER — Other Ambulatory Visit (HOSPITAL_COMMUNITY): Payer: Self-pay | Admitting: Oncology

## 2011-03-25 ENCOUNTER — Ambulatory Visit (HOSPITAL_BASED_OUTPATIENT_CLINIC_OR_DEPARTMENT_OTHER): Payer: Medicare Other | Admitting: Physician Assistant

## 2011-03-25 VITALS — BP 159/83 | HR 70 | Temp 96.8°F | Ht 64.0 in | Wt 109.8 lb

## 2011-03-25 DIAGNOSIS — C50919 Malignant neoplasm of unspecified site of unspecified female breast: Secondary | ICD-10-CM

## 2011-03-25 DIAGNOSIS — C773 Secondary and unspecified malignant neoplasm of axilla and upper limb lymph nodes: Secondary | ICD-10-CM

## 2011-03-25 DIAGNOSIS — C7951 Secondary malignant neoplasm of bone: Secondary | ICD-10-CM

## 2011-03-25 LAB — CBC WITH DIFFERENTIAL/PLATELET
Basophils Absolute: 0.1 10*3/uL (ref 0.0–0.1)
EOS%: 1.5 % (ref 0.0–7.0)
HGB: 12.2 g/dL (ref 11.6–15.9)
LYMPH%: 22.1 % (ref 14.0–49.7)
MCH: 29.7 pg (ref 25.1–34.0)
MCV: 86.3 fL (ref 79.5–101.0)
MONO%: 11.8 % (ref 0.0–14.0)
Platelets: 189 10*3/uL (ref 145–400)
RBC: 4.13 10*6/uL (ref 3.70–5.45)
RDW: 14.5 % (ref 11.2–14.5)

## 2011-03-25 LAB — COMPREHENSIVE METABOLIC PANEL
BUN: 11 mg/dL (ref 6–23)
CO2: 27 mEq/L (ref 19–32)
Calcium: 9.2 mg/dL (ref 8.4–10.5)
Chloride: 103 mEq/L (ref 96–112)
Creatinine, Ser: 0.77 mg/dL (ref 0.50–1.10)

## 2011-03-25 LAB — LACTATE DEHYDROGENASE: LDH: 197 U/L (ref 94–250)

## 2011-03-25 LAB — CANCER ANTIGEN 27.29: CA 27.29: 226 U/mL — ABNORMAL HIGH (ref 0–39)

## 2011-03-25 MED ORDER — ZOLEDRONIC ACID 4 MG/5ML IV CONC
3.5000 mg | Freq: Once | INTRAVENOUS | Status: AC
  Administered 2011-03-25: 3.5 mg via INTRAVENOUS
  Filled 2011-03-25: qty 4.38

## 2011-03-25 NOTE — Telephone Encounter (Signed)
gve the pt her dec 2012 appt calendar °

## 2011-03-25 NOTE — Progress Notes (Signed)
This office note has been dictated.

## 2011-03-25 NOTE — Progress Notes (Signed)
CC:   Elizabeth [name], [name], [name] Elizabeth [name], [name] Elizabeth [name], [name]  INTERIM HISTORY:  Elizabeth [name] returns to clinic for followup of her metastatic breast cancer.  She is evaluated today prior to her next Zometa infusion.  She reports since her last clinic visit one month ago she has had overall normal energy level.  No fevers, chills, or night sweats.  No problems with dyspnea or cough.  She feels that she is eating normally and has had no issues with nausea, vomiting, constipation, or diarrhea.  No dysuria, no frequency, or hematuria.  No alteration in sensation or balance or swelling of extremities.  She states she is not having any pain, but does have some intermittent pruritus at the site of her right chest wall lesion which she states has not changed in size and she is not experiencing any drainage at this area.  Current medications are reviewed and recorded.  PHYSICAL EXAMINATION:  Temperature is 96.8, heart rate 70, respirations 18, blood pressure 159/83, weight 109.8 pounds.  General:  This is a thin white female in no acute distress.  HEENT:  Sclerae nonicteric. There is no oral thrush or mucositis.  Skin:  An erythematous ulcerated skin lesion of the right chest wall measuring approximately 2 x 2 cm with central area with yellow tissue with no drainage.  She also has an indurated erythematous lesion mid chest with no surrounding drainage or pain noted.  Lymph:  No peripheral lymphadenopathy.  Cardiac:  Regular rate and rhythm without murmurs or gallops.  Peripheral pulses are 2+. Chest:  Lungs are clear to auscultation.  Abdomen:  Positive bowel sounds.  Soft, nontender, nondistended.  No organomegaly.  Extremities: Without edema, cyanosis or calf tenderness.  Neurologic:  Alert and oriented x3.  Strength, sensation, and coordination all grossly intact.  LABORATORY DATA:  CBC with diff reveals white blood count of 5.2, hemoglobin 12.2, hematocrit 35.6, platelets  of 189, ANC of 3.2 and MCV of 86.3.  CMET, LDH, CEA and CA 27.29 are currently pending.  IMPRESSION/PLAN: 1. Elizabeth [name] is an 77 year old white female with metastatic     breast cancer with extensive bony metastatic involvement.  She also     has history of right pleural effusion and right breast mass and     possible involvement of the liver.  She is status post palliative     external radiation therapy T7-T12 between August 8th and December 22, 2006 and is currently on Femara 2.5 mg p.o. daily since February 09, 2007.  She did have her last PET scan in September 2012 which     revealed some hypermetabolic nodule in the right chest wall     consistent with malignancy.  The patient has continued on Femara     since that time as her widespread skeletal metastasis was     unchanged.  The patient also receives Zometa q.2 months and will     receive her next Zometa infusion today.  Her tumor markers for     today are pending and she will be contacted with results.  If she     has continued increase in her tumor markers, we will consider a     change in therapy with possibly Faslodex or Aromasin. 2. The patient will be scheduled for followup with Dr. Arline Asp in 1     month's time at which time we will reassess     CBC  with diff, CMET, LDH, CEA and CA 27.29.  She is advised to call     in the interim if any questions or problems.   ______________________________ Sherilyn Banker, MSN, ANP, BC RJ/MEDQ  D:  03/25/2011  T:  03/25/2011  Job:  [id number]

## 2011-04-16 ENCOUNTER — Telehealth: Payer: Self-pay | Admitting: Medical Oncology

## 2011-04-16 NOTE — Telephone Encounter (Signed)
I called pt to verify her appointment for Monday.

## 2011-04-20 ENCOUNTER — Ambulatory Visit: Payer: Self-pay | Admitting: Oncology

## 2011-04-20 ENCOUNTER — Ambulatory Visit: Payer: Self-pay

## 2011-04-20 ENCOUNTER — Telehealth: Payer: Self-pay | Admitting: Medical Oncology

## 2011-04-20 ENCOUNTER — Other Ambulatory Visit (HOSPITAL_BASED_OUTPATIENT_CLINIC_OR_DEPARTMENT_OTHER): Payer: Medicare Other | Admitting: Lab

## 2011-04-20 ENCOUNTER — Ambulatory Visit (HOSPITAL_BASED_OUTPATIENT_CLINIC_OR_DEPARTMENT_OTHER): Payer: Medicare Other | Admitting: Oncology

## 2011-04-20 ENCOUNTER — Encounter (HOSPITAL_COMMUNITY): Payer: Self-pay | Admitting: Oncology

## 2011-04-20 ENCOUNTER — Other Ambulatory Visit: Payer: Self-pay | Admitting: Lab

## 2011-04-20 VITALS — BP 147/74 | HR 60 | Temp 96.9°F | Ht 64.0 in | Wt 107.7 lb

## 2011-04-20 DIAGNOSIS — L98499 Non-pressure chronic ulcer of skin of other sites with unspecified severity: Secondary | ICD-10-CM

## 2011-04-20 DIAGNOSIS — Z170 Estrogen receptor positive status [ER+]: Secondary | ICD-10-CM

## 2011-04-20 DIAGNOSIS — C50919 Malignant neoplasm of unspecified site of unspecified female breast: Secondary | ICD-10-CM

## 2011-04-20 DIAGNOSIS — C7952 Secondary malignant neoplasm of bone marrow: Secondary | ICD-10-CM

## 2011-04-20 LAB — COMPREHENSIVE METABOLIC PANEL
ALT: 9 U/L (ref 0–35)
AST: 25 U/L (ref 0–37)
Albumin: 4.5 g/dL (ref 3.5–5.2)
Calcium: 8.9 mg/dL (ref 8.4–10.5)
Chloride: 105 mEq/L (ref 96–112)
Creatinine, Ser: 0.93 mg/dL (ref 0.50–1.10)
Potassium: 3.6 mEq/L (ref 3.5–5.3)

## 2011-04-20 LAB — LACTATE DEHYDROGENASE: LDH: 196 U/L (ref 94–250)

## 2011-04-20 LAB — CBC WITH DIFFERENTIAL/PLATELET
BASO%: 1.6 % (ref 0.0–2.0)
MCHC: 33 g/dL (ref 31.5–36.0)
MONO#: 0.5 10*3/uL (ref 0.1–0.9)
RBC: 4.45 10*6/uL (ref 3.70–5.45)
RDW: 14.4 % (ref 11.2–14.5)
WBC: 4.9 10*3/uL (ref 3.9–10.3)
lymph#: 1.3 10*3/uL (ref 0.9–3.3)
nRBC: 0 % (ref 0–0)

## 2011-04-20 LAB — CEA: CEA: 40.9 ng/mL — ABNORMAL HIGH (ref 0.0–5.0)

## 2011-04-20 NOTE — Progress Notes (Signed)
Tumor markers CEA and CA 27.29 have increased further.  Placed orders for Faslodex 500 mg to start at time of next appt--05/25/11, given every 2 weeks for 3 doses then every month.  Placed under orders only, now [employer] won't allow me to close chart--not a face to face encounter, but I did see patient earlier today for an office visit.

## 2011-04-20 NOTE — Progress Notes (Signed)
CC:   Elizabeth [name], [name], [name] [name], [name]  HISTORY:  Elizabeth [name] was seen today for followup of her metastatic breast cancer with diagnosis dating back to October 2008.  It will be recalled that the patient has widespread disease as she did when she presented.  At the time of presentation she had a trapped right lung with pleural effusion, and a neglected right breast mass.  She has extensive bone metastases.  She was also suspected to have liver metastases at that time.  The patient has done extraordinarily well on Femara 2.5 mg a day, and spot radiation treatments, initially to the T- spine, subsequently to the right chest wall.  She was started on Zometa back in early December 2011 after receiving dental clearance.  The patient is here today with her grandson Elizabeth [name].  She denies any changes in her condition.  Specifically she denies any pain, trouble breathing, any GI, GU or other symptomatology.  Her condition seems to be generally stable.  She does have an ulcer on her right chest wall that has been there now since July.  The patient says it bleeds when she picks at it.  PROBLEM LIST: 1. Metastatic breast cancer originating in the right breast, neglected     primary with diagnosis going back to October 2008.  At presentation     the patient had a right trapped lung with pleural disease,     extensive bone metastases and probable liver metastases.  Biopsy     showed invasive ductal cancer.  Estrogen receptor 91%, progesterone     receptor 1%, HER-2/neu was 1+.  At that time the patient was having     fairly severe pain in her back.  Also had impending T10 cord     compression.  She received radiation treatments to the thoracic     spine from T7-T12 from 02/02/2007 through 02/21/2007.  She was     started on Femara 2.5 mg a day on 02/09/2007 and Zometa on     03/28/2010 after receiving dental clearance.  The patient was noted     to have progression on her right  chest wall in March 2011 and     received radiation treatments to the right chest wall 2000 cGy in     10 fractions from 10/10/2009 through 10/23/2009.  She continues on     Femara 2.5 mg daily.  She does have elevated tumor markers,     specifically CEA and CA 27.29. 2. Hearing impairment. 3. Glaucoma. 4. Gallstones evident on CT scan. 5. Weight loss.  MEDICINES:  Reviewed and recorded.  The patient is on dorzolamide- timolol eye drops, Xalatan eyedrops and Femara 2.5 mg daily.  PHYSICAL EXAMINATION:  The patient is thin, elderly, frail, needs some help getting up to the examining table.  There is no obvious change in her condition.  Weight is 107 pounds 11.2 ounces as compared with 109 pounds 12.8 ounces on 11/28, height 5 feet 4 inches, body surface area 1.49 m squared, blood pressure 147/74.  Other vital signs are normal. There is no scleral icterus.  Mouth and pharynx are benign.  No peripheral adenopathy palpable.  Heart/lungs:  Normal.  Lungs were clear.  Right chest wall continues show scarring and shallow ulcer that today measures 3 cm in its longest dimension which is diagonal x2 cm perpendicular.  I do not see any changes in this lesion compared with my measurements from 02/19/2011.  There is some nodular firm  induration which could be scar tissue or tumor above this and medially.  Overall I do not see any major change in the appearance of the right chest wall. The patient has undergone essentially an auto mastectomy followed by radiation.  Both axilla are negative.  Left breast is pendulous and benign.  Abdomen:  Benign with no organomegaly or masses palpable.  No distention or ascites.  Extremities:  No peripheral edema.  No port.  No focal findings.  The patient does have hearing loss.  No obvious lymphedema of the right arm.  LABORATORY DATA:  Today white count 4.9, ANC 2.9, hemoglobin 12.6, hematocrit 38.2, and platelets 178,000.  Chemistries, CEA and CA 27.29 are  pending.  Chemistries from 03/25/2011 were normal.  CEA on 11/28 was 36.0 as compared with 28.0 on 02/19/2011.  CA 27.29 was 226 as compared with 192 on 02/19/2011.  IMPRESSION/PLAN:  Clinically Elizabeth [name] is stable.  It will be recalled that her last PET scan was carried out on 09/18, last bone scan on 07/30/2010, last CT scans of chest, abdomen, and pelvis also on 07/30/2010.  There has been no significant progression although the tumor markers are slowly rising.  The patient fortunately has very indolent disease, is now out past 4 years from the time of diagnosis when she had extensive metastatic breast cancer.  The plan at this point is to continue the patient on Femara 2.5 mg daily.  If her tumor markers continue to rise, then we will probably make a change as discussed in previous notes, specifically to Faslodex or Aromasin.  The patient's ulcer is stable.  We are treating the patient with Zometa 3.5 mg every 2 months.  She last received Zometa on her last visit here on 11/28 and will be due for another dose of Zometa 3.5 mg IV on January 28.  At that time we will check CBC, chemistries, CEA, CA 27.29 and the patient is scheduled for Zometa on January 28.    ______________________________ Samul Dada, [name] DSM/MEDQ  D:  04/20/2011  T:  04/20/2011  Job:  [id number]

## 2011-04-20 NOTE — Telephone Encounter (Signed)
appt made and printed for pt for 05/29/11    aom

## 2011-04-20 NOTE — Progress Notes (Signed)
This office note has been dictated.    #[id number]

## 2011-04-20 NOTE — Progress Notes (Signed)
This is a duplicate encounter which is not billable.

## 2011-04-27 ENCOUNTER — Other Ambulatory Visit: Payer: Self-pay | Admitting: Oncology

## 2011-05-29 ENCOUNTER — Other Ambulatory Visit (HOSPITAL_BASED_OUTPATIENT_CLINIC_OR_DEPARTMENT_OTHER): Payer: Medicare Other | Admitting: Lab

## 2011-05-29 ENCOUNTER — Encounter: Payer: Self-pay | Admitting: Oncology

## 2011-05-29 ENCOUNTER — Ambulatory Visit (HOSPITAL_BASED_OUTPATIENT_CLINIC_OR_DEPARTMENT_OTHER): Payer: Medicare Other | Admitting: Oncology

## 2011-05-29 ENCOUNTER — Other Ambulatory Visit: Payer: Self-pay | Admitting: Oncology

## 2011-05-29 ENCOUNTER — Ambulatory Visit (HOSPITAL_BASED_OUTPATIENT_CLINIC_OR_DEPARTMENT_OTHER): Payer: Medicare Other

## 2011-05-29 VITALS — BP 157/81 | HR 68 | Temp 98.2°F | Ht 64.0 in | Wt 105.6 lb

## 2011-05-29 DIAGNOSIS — C50919 Malignant neoplasm of unspecified site of unspecified female breast: Secondary | ICD-10-CM

## 2011-05-29 DIAGNOSIS — C7951 Secondary malignant neoplasm of bone: Secondary | ICD-10-CM

## 2011-05-29 DIAGNOSIS — Z170 Estrogen receptor positive status [ER+]: Secondary | ICD-10-CM

## 2011-05-29 DIAGNOSIS — C801 Malignant (primary) neoplasm, unspecified: Secondary | ICD-10-CM

## 2011-05-29 DIAGNOSIS — C7952 Secondary malignant neoplasm of bone marrow: Secondary | ICD-10-CM

## 2011-05-29 DIAGNOSIS — Z79811 Long term (current) use of aromatase inhibitors: Secondary | ICD-10-CM

## 2011-05-29 LAB — CBC WITH DIFFERENTIAL/PLATELET
BASO%: 0.4 % (ref 0.0–2.0)
Eosinophils Absolute: 0.1 10*3/uL (ref 0.0–0.5)
LYMPH%: 26.9 % (ref 14.0–49.7)
MCHC: 32.1 g/dL (ref 31.5–36.0)
MONO#: 0.6 10*3/uL (ref 0.1–0.9)
NEUT#: 3 10*3/uL (ref 1.5–6.5)
Platelets: 209 10*3/uL (ref 145–400)
RBC: 4.53 10*6/uL (ref 3.70–5.45)
WBC: 5.1 10*3/uL (ref 3.9–10.3)
lymph#: 1.4 10*3/uL (ref 0.9–3.3)
nRBC: 0 % (ref 0–0)

## 2011-05-29 LAB — LACTATE DEHYDROGENASE: LDH: 204 U/L (ref 94–250)

## 2011-05-29 LAB — COMPREHENSIVE METABOLIC PANEL
ALT: 10 U/L (ref 0–35)
AST: 22 U/L (ref 0–37)
Albumin: 4.3 g/dL (ref 3.5–5.2)
CO2: 23 mEq/L (ref 19–32)
Calcium: 8.8 mg/dL (ref 8.4–10.5)
Chloride: 106 mEq/L (ref 96–112)
Creatinine, Ser: 0.8 mg/dL (ref 0.50–1.10)
Potassium: 3.5 mEq/L (ref 3.5–5.3)
Sodium: 143 mEq/L (ref 135–145)
Total Protein: 7.1 g/dL (ref 6.0–8.3)

## 2011-05-29 LAB — CANCER ANTIGEN 27.29: CA 27.29: 289 U/mL — ABNORMAL HIGH (ref 0–39)

## 2011-05-29 LAB — CEA: CEA: 45.8 ng/mL — ABNORMAL HIGH (ref 0.0–5.0)

## 2011-05-29 MED ORDER — SODIUM CHLORIDE 0.9 % IJ SOLN
10.0000 mL | INTRAMUSCULAR | Status: DC | PRN
  Filled 2011-05-29: qty 10

## 2011-05-29 MED ORDER — ZOLEDRONIC ACID 4 MG/5ML IV CONC
3.5000 mg | Freq: Once | INTRAVENOUS | Status: AC
  Administered 2011-05-29: 3.5 mg via INTRAVENOUS
  Filled 2011-05-29: qty 4.38

## 2011-05-29 MED ORDER — SODIUM CHLORIDE 0.9 % IV SOLN
Freq: Once | INTRAVENOUS | Status: AC
  Administered 2011-05-29: 11:00:00 via INTRAVENOUS

## 2011-05-29 MED ORDER — FULVESTRANT 250 MG/5ML IM SOLN: 500.0000 mg | INTRAMUSCULAR | Status: DC

## 2011-05-29 NOTE — Progress Notes (Signed)
This office note has been dictated.  #[id number]

## 2011-05-29 NOTE — Progress Notes (Signed)
CC:   Elizabeth [name], [name], [name] Elizabeth [name], [name]   PROBLEM LIST:  1. Metastatic breast cancer originating in the right breast, neglected  primary with diagnosis going back to October 2008. At presentation  the patient had a right trapped lung with pleural disease,  extensive bone metastases and probable liver metastases. Biopsy  showed invasive ductal cancer. Estrogen receptor 91%, progesterone  receptor 1%, HER-2/neu was 1+. At that time the patient was having  fairly severe pain in her back. Also had impending T10 cord  compression. She received radiation treatments to the thoracic  spine from T7-T12 from 02/02/2007 through 02/21/2007. She was  started on Femara 2.5 mg a day on 02/09/2007 and Zometa on  03/28/2010 after receiving dental clearance. The patient was noted  to have progression on her right chest wall in March 2011 and  received radiation treatments to the right chest wall 2000 cGy in  10 fractions from 10/10/2009 through 10/23/2009. She continues on  Femara 2.5 mg daily. She does have elevated tumor markers,  specifically CEA and CA 27.29.  2. Hearing impairment.  3. Glaucoma.  4. Gallstones evident on CT scan.  5. Weight loss   MEDICATIONS: 1. Cosopt ophthalmic solution. 2. Xalatan 0.005% ophthalmic solution. 3. Femara 2.5 mg daily.  HISTORY:  Elizabeth [name] who is now 78 years old is being followed by [place] for metastatic breast cancer, currently on Femara 2.5 mg a day.  She also receives Zometa every 2 months.  Elizabeth [name] was last seen by [place] on 04/20/2011.  She is accompanied by her grandson, Elizabeth [name].  She denies any changes in her condition.  Appetite is fair.  Her energy is good.  She reads a lot.  She denies any pain, respiratory problems. As stated, she denies any major changes in her condition.  PHYSICAL EXAM:  The patient is thin, elderly and frail.  She needs some help getting up to the examining table, also getting down from the examining table.   There is no obvious change in her condition.  Her weight today is 105 pounds 9.6 ounces as compared with 107 pounds 11.2 ounces, thus there has been a 2 pound weight loss.  Height 5 feet 4 inches, body surface area 1.47 meters squared.  Blood pressure today 157/81.  Other vital signs are normal. There is no scleral icterus. Mouth and pharynx are benign. No  peripheral adenopathy palpable. Heart/lungs: Normal. Lungs were  clear. Right chest wall continues show scarring and shallow ulcer that  today measures 3 cm in its longest dimension which is diagonal x2 cm  perpendicular. I do not see any changes in this lesion compared with my  measurements from 02/19/2011. There is some nodular firm induration  which could be scar tissue or tumor above this and medially. Overall I  do not see any major change in the appearance of the right chest wall.  The patient has undergone essentially an auto mastectomy followed by  radiation. Both axilla are negative. Left breast is pendulous and  benign. Abdomen: Benign with no organomegaly or masses palpable. No  distention or ascites. Extremities: No peripheral edema. No port. No  focal findings. The patient does have hearing loss. No obvious  lymphedema of the right arm.   LABORATORY DATA:  White count 5.1, ANC 3.0, hemoglobin 12.6, hematocrit 39.3, platelets 209,000.  Chemistries today as well as CEA and CA 27.29 are pending.  Chemistries from 04/20/2011 were normal.  CA 27.29 was 235, CEA 40.9 as  compared with 226 and 36.0 respectively on 03/25/2011, 192 and 28.0 on 02/19/2011.  IMAGING STUDIES: 1. CT scan of the chest, abdomen and pelvis with IV contrast on     08/22/2009 showed some improvement in the transpleural spread of     tumor involving the right hemithorax.  The right effusion has     decreased in size from the prior exam of 01/31/2007.  There is a     decrease in the size of the spiculated right middle lobe lesion.     There is diffuse  bone metastases with some interval healing of the     destructive lesion involving the posterior elements of T10.  The     right chest wall lesion had not significantly changed in size.     There was a new lesion adjacent to the chest wall lesion measuring     0.9 cm.  There were gallstones noted.  No focal liver     abnormalities. 2. Nuclear medicine whole bone scan from 08/22/2009 showed diffuse     osseous metastases involving the axillary appendicular skeleton. 3. CT scan of the chest, abdomen and pelvis with IV contrast on     07/30/2010 showed no change in the right chest wall masses, similar     lung nodules, similar right-sided pleural effusion and pleural     thickening and similar osseous metastases.  There was no evidence     for extra osseous metastases within the abdomen and pelvis.     Cholelithiasis was present.  These scans were compared with the CT     scans of 08/14/2009. 4. Bone scan from 07/30/2010 showed widespread osseous metastatic     disease. 5. PET scan from 01/13/2011 showed hypermetabolic nodule within the     right chest wall consistent with malignancy.  There was adjacent     skin thickening.  There was a probable hypermetabolic nodule or     atelectasis at the right lung base.  There was a stable right lower     lobe pulmonary nodule.  There was widespread metastatic disease in     the skeleton that was unchanged.  Several of the sclerotic lesions     were hypermetabolic consistent with active metastatic process.  IMPRESSION AND PLAN:  Elizabeth [name] is due for Zometa today 3.5 mg.  This was last given on 04/20/2011 and is being given every 2 months.  The patient is tolerating this well, had previous dental clearance.  Clinically the patient is unchanged.  Once again, we talked about nutritional supplements.  For some reason, the patient is not taking in nutritional supplements despite multiple discussions about her slow progressive weight loss.  Aside from  this fact she seems to be stable clinically despite the progressive rise of her tumor markers.  For now I am not inclined to make any changes.  We have contemplated changing her to Faslodex or Aromasin.  Another possibility would be to add Afinitor to Femara.  As stated, the patient's last PET scan was on 01/13/2011. At some point, we may want to repeat imaging studies.  Even though the tumor markers are rising I am inclined not to make any changes in this elderly woman in the absence of more clinically significant progression.  As stated, she will need repeat imaging studies within the next few months.  PLAN:  Will plan to see Ms. Vandemark again in 2 months around 03/25 at which time will check CBC, chemistries, CEA and CA  27.29.  She will be due for another dose of Zometa 3.5 mg at that time.    ______________________________ Samul Dada, [name] DSM/MEDQ  D:  05/29/2011  T:  05/29/2011  Job:  [id number]

## 2011-06-12 ENCOUNTER — Ambulatory Visit: Payer: Self-pay

## 2011-06-18 ENCOUNTER — Other Ambulatory Visit: Payer: Self-pay | Admitting: Family Medicine

## 2011-06-19 ENCOUNTER — Other Ambulatory Visit: Payer: Self-pay | Admitting: Family Medicine

## 2011-06-19 ENCOUNTER — Other Ambulatory Visit: Payer: Self-pay

## 2011-06-19 DIAGNOSIS — C50919 Malignant neoplasm of unspecified site of unspecified female breast: Secondary | ICD-10-CM

## 2011-06-19 MED ORDER — LETROZOLE 2.5 MG PO TABS: 2.5000 mg | ORAL_TABLET | Freq: Every day | ORAL | Status: DC

## 2011-06-25 ENCOUNTER — Telehealth: Payer: Self-pay | Admitting: Oncology

## 2011-06-25 NOTE — Telephone Encounter (Signed)
called pt with appt r/s,moved from 3/25 to 4/2   aom

## 2011-06-26 ENCOUNTER — Ambulatory Visit: Payer: Self-pay

## 2011-07-07 ENCOUNTER — Ambulatory Visit (HOSPITAL_COMMUNITY): Payer: Medicaid - Dental | Admitting: Dentistry

## 2011-07-07 ENCOUNTER — Encounter (HOSPITAL_COMMUNITY): Payer: Self-pay | Admitting: Dentistry

## 2011-07-07 DIAGNOSIS — M264 Malocclusion, unspecified: Secondary | ICD-10-CM

## 2011-07-07 DIAGNOSIS — C50919 Malignant neoplasm of unspecified site of unspecified female breast: Secondary | ICD-10-CM

## 2011-07-07 DIAGNOSIS — C801 Malignant (primary) neoplasm, unspecified: Secondary | ICD-10-CM

## 2011-07-07 DIAGNOSIS — K036 Deposits [accretions] on teeth: Secondary | ICD-10-CM

## 2011-07-07 DIAGNOSIS — Z79899 Other long term (current) drug therapy: Secondary | ICD-10-CM

## 2011-07-07 DIAGNOSIS — K08199 Complete loss of teeth due to other specified cause, unspecified class: Secondary | ICD-10-CM

## 2011-07-07 NOTE — Progress Notes (Signed)
Tuesday, July 07, 2011   BP: 120/70           P: 64        T: 97.2  Elizabeth [name] is an 78 yo female previously referred by Dr. Mancel Bale for a Dental Consultation. Patient has Metastatic Breast Cancer to the Bone and has received multiple doses of of IV Zometa.  Patient. is at risk for bisphosphonate related osteonecrosis of the jaw (BRONJ).  Patient had three teeth (1,61,09) extracted on 12/11/2009. Patient now presents for re-evaluation of healing and to R/O BRONJ.  Patient Active Problem List  Diagnoses  . Breast cancer metastasized to bone   No Known Allergies  Current Outpatient Prescriptions  Medication Sig Dispense Refill  . dorzolamide-timolol (COSOPT) 22.3-6.8 MG/ML ophthalmic solution Daily.      . fulvestrant (FASLODEX) 250 MG/5ML injection Inject 10 mLs (500 mg total) into the muscle every 30 (thirty) days. One injection each buttock over 1-2 minutes. Warm prior to use.  5 mL  0  . latanoprost (XALATAN) 0.005 % ophthalmic solution Daily.      Marland Kitchen letrozole (FEMARA) 2.5 MG tablet Take 1 tablet (2.5 mg total) by mouth daily.  30 tablet  3   S: Patient has no problems with mouth or previous dental extraction sites. Patient continues to use chlorhexidine rinses twice a day. No exposed bone noted by patient report. Exam: No signs of infection or exposed bone. Extractions sites have healed in completely.  Previous chronic periodontitis and mobile teeth noted. Patitent. has improved oral hygiene dramatically. A: Post op course consistent with dental procedures. No exposed bone or evidence of bisphosphonate related osteonecrosis of the jaw. Plan: 1. Patient to continue chlorhexidine rinses twice daily and to maintain oral hygiene as best able.  2. Return to dental medicine in 12 months by patient request . She is to contact dental medicine if acute problems arise or if exposed bone is noted. Erline Hau, expresses understanding as well and will call for patient as needed.  Dr.  [name]

## 2011-07-20 ENCOUNTER — Ambulatory Visit: Payer: Self-pay | Admitting: Oncology

## 2011-07-20 ENCOUNTER — Ambulatory Visit: Payer: Self-pay

## 2011-07-20 ENCOUNTER — Other Ambulatory Visit: Payer: Self-pay

## 2011-07-27 ENCOUNTER — Ambulatory Visit: Payer: Self-pay | Admitting: Oncology

## 2011-07-28 ENCOUNTER — Other Ambulatory Visit: Payer: Self-pay | Admitting: Oncology

## 2011-07-28 ENCOUNTER — Telehealth: Payer: Self-pay | Admitting: Oncology

## 2011-07-28 ENCOUNTER — Other Ambulatory Visit (HOSPITAL_BASED_OUTPATIENT_CLINIC_OR_DEPARTMENT_OTHER): Payer: Medicare Other | Admitting: Lab

## 2011-07-28 ENCOUNTER — Encounter: Payer: Self-pay | Admitting: Oncology

## 2011-07-28 ENCOUNTER — Ambulatory Visit (HOSPITAL_BASED_OUTPATIENT_CLINIC_OR_DEPARTMENT_OTHER): Payer: Medicare Other | Admitting: Oncology

## 2011-07-28 ENCOUNTER — Ambulatory Visit (HOSPITAL_BASED_OUTPATIENT_CLINIC_OR_DEPARTMENT_OTHER): Payer: Medicare Other

## 2011-07-28 VITALS — BP 160/80 | HR 59 | Temp 97.5°F | Ht 64.0 in | Wt 105.2 lb

## 2011-07-28 DIAGNOSIS — C50919 Malignant neoplasm of unspecified site of unspecified female breast: Secondary | ICD-10-CM

## 2011-07-28 DIAGNOSIS — C7951 Secondary malignant neoplasm of bone: Secondary | ICD-10-CM

## 2011-07-28 DIAGNOSIS — R911 Solitary pulmonary nodule: Secondary | ICD-10-CM

## 2011-07-28 DIAGNOSIS — Z5111 Encounter for antineoplastic chemotherapy: Secondary | ICD-10-CM

## 2011-07-28 DIAGNOSIS — C7952 Secondary malignant neoplasm of bone marrow: Secondary | ICD-10-CM

## 2011-07-28 LAB — CBC WITH DIFFERENTIAL/PLATELET
BASO%: 0.2 % (ref 0.0–2.0)
EOS%: 2.2 % (ref 0.0–7.0)
LYMPH%: 22.3 % (ref 14.0–49.7)
MCHC: 32.8 g/dL (ref 31.5–36.0)
MCV: 86.2 fL (ref 79.5–101.0)
MONO%: 10.4 % (ref 0.0–14.0)
Platelets: 189 10*3/uL (ref 145–400)
RBC: 4.35 10*6/uL (ref 3.70–5.45)
WBC: 5.4 10*3/uL (ref 3.9–10.3)
nRBC: 0 % (ref 0–0)

## 2011-07-28 LAB — LACTATE DEHYDROGENASE: LDH: 205 U/L (ref 94–250)

## 2011-07-28 LAB — COMPREHENSIVE METABOLIC PANEL
ALT: 9 U/L (ref 0–35)
AST: 27 U/L (ref 0–37)
Alkaline Phosphatase: 94 U/L (ref 39–117)
Creatinine, Ser: 0.79 mg/dL (ref 0.50–1.10)
Total Bilirubin: 0.9 mg/dL (ref 0.3–1.2)

## 2011-07-28 MED ORDER — HEPARIN SOD (PORK) LOCK FLUSH 100 UNIT/ML IV SOLN
500.0000 [IU] | Freq: Once | INTRAVENOUS | Status: DC | PRN
  Filled 2011-07-28: qty 5

## 2011-07-28 MED ORDER — HEPARIN SOD (PORK) LOCK FLUSH 100 UNIT/ML IV SOLN
250.0000 [IU] | Freq: Once | INTRAVENOUS | Status: DC | PRN
  Filled 2011-07-28: qty 5

## 2011-07-28 MED ORDER — FULVESTRANT 250 MG/5ML IM SOLN: 500.0000 mg | INTRAMUSCULAR | Status: DC

## 2011-07-28 MED ORDER — ZOLEDRONIC ACID 4 MG/5ML IV CONC
3.5000 mg | Freq: Once | INTRAVENOUS | Status: AC
  Administered 2011-07-28: 3.5 mg via INTRAVENOUS
  Filled 2011-07-28: qty 4.38

## 2011-07-28 MED ORDER — FULVESTRANT 250 MG/5ML IM SOLN
500.0000 mg | Freq: Once | INTRAMUSCULAR | Status: AC
  Administered 2011-07-28: 500 mg via INTRAMUSCULAR
  Filled 2011-07-28: qty 10

## 2011-07-28 MED ORDER — SODIUM CHLORIDE 0.9 % IV SOLN: Freq: Once | INTRAVENOUS | Status: DC

## 2011-07-28 MED ORDER — SODIUM CHLORIDE 0.9 % IJ SOLN
10.0000 mL | INTRAMUSCULAR | Status: DC | PRN
  Filled 2011-07-28: qty 10

## 2011-07-28 MED ORDER — SODIUM CHLORIDE 0.9 % IJ SOLN
3.0000 mL | Freq: Once | INTRAMUSCULAR | Status: DC | PRN
  Filled 2011-07-28: qty 10

## 2011-07-28 MED ORDER — ALTEPLASE 2 MG IJ SOLR
2.0000 mg | Freq: Once | INTRAMUSCULAR | Status: DC | PRN
  Filled 2011-07-28: qty 2

## 2011-07-28 NOTE — Progress Notes (Signed)
This office note has been dictated.  #[id number]

## 2011-07-28 NOTE — Progress Notes (Signed)
CC:   Elizabeth [name], [name], [name] [name], [name]   PROBLEM LIST:  1. Metastatic breast cancer originating in the right breast, neglected  primary with diagnosis going back to October 2008. At presentation  the patient had a right trapped lung with pleural disease,  extensive bone metastases and probable liver metastases. Biopsy  showed invasive ductal cancer. Estrogen receptor 91%, progesterone  receptor 1%, HER-2/neu was 1+. At that time the patient was having  fairly severe pain in her back. Also had impending T10 cord  compression. She received radiation treatments to the thoracic  spine from T7-T12 from 02/02/2007 through 02/21/2007. She was  started on Femara 2.5 mg a day on 02/09/2007 and Zometa on  03/28/2010 after receiving dental clearance. The patient was noted  to have progression on her right chest wall in March 2011 and  received radiation treatments to the right chest wall 2000 cGy in  10 fractions from 10/10/2009 through 10/23/2009. She continues on  Femara 2.5 mg daily. She does have elevated tumor markers,  specifically CEA and CA 27.29. On 02/09/2007, CA27.29 was 2883 and the CEA was 3331. 2. Hearing impairment.  3. Glaucoma.  4. Gallstones evident on CT scan.  5. Weight loss.    MEDICATIONS:  1. Cosopt ophthalmic solution.  2. Xalatan 0.005% ophthalmic solution.  3. Femara 2.5 mg daily.   HISTORY:  I saw Elizabeth [name] today for followup of her metastatic breast cancer, currently on Femara 2.5 mg daily.  This medicine was started on 02/09/2007 at the time of the patient's presentation and diagnosis of her breast cancer.  Elizabeth [name] had an outstanding response to this medicine initially but in recent months it appears that her disease is slowly progressing by tumor markers and by changes on her right chest wall, particularly ulceration.  In addition, Elizabeth [name] is on Zometa 3.5 mg every 2 months.  This was started on 03/28/2010 after the patient  underwent dental clearance.  She saw her dentist a month ago and he said that her teeth were in excellent condition.  There was no contraindication to continuing with the Zometa.  Elizabeth [name] was last seen by [place] on 05/29/2011.  As always, she is accompanied by her grandson, [name].  She denies any changes in her condition.  She says her chest wall itches and she picks away at it. She denies any difficulties with eating or breathing.  She denies any musculoskeletal pain and she is not on any narcotics or pain medicines.  PHYSICAL EXAMINATION:  The patient is a frail 78 year old woman who needs a little help getting up to the examining table.  She ambulates without any assistance.  Weight is 105.2 pounds, down about a pound and a half from her last visit here 2 months ago.  Height 5 feet 4 inches, body surface area 1.47 meters squared.  Blood pressure today 160/80. Other vital signs are normal.  There is no scleral icterus.  Mouth and pharynx are benign.  There is no peripheral adenopathy palpable.  Heart and lungs are normal.  Right chest wall now reveals 2 ulcers.  The alteration that is over the right chest wall adjacent to the left breast now measures about 4 cm in longest dimension, 2 cm in shortest dimension at its width.  Previously this ulceration measured about 3 cm.  Above this now and more toward the midline is another oval ulceration measuring about 1.5 cm in maximum dimension x about 1 cm in width.  This  ulceration was not present.  Superior and cephalad to this newest ulceration is a firm heaped up ridge of what appears to be tumor.  These changes are slowly evolving but seem to be more pronounced than they were previously.  There is also some hyperpigmentation.  The patient had gone undergone an auto mastectomy followed by radiation.  Left breast is pendulous and benign.  No axillary adenopathy.  Abdomen is benign.  The patient does not have a Port-A-Cath or central catheter.   She has never received chemotherapy.  Extremities, no peripheral edema.  No obvious lymphedema of the right arm.  Neurologic exam is nonfocal.  LABORATORY DATA:  Today, white count 5.4, ANC 3.5, hemoglobin 12.3, hematocrit 37.5, platelets 189,000.  Chemistries and tumor markers, CEA and CA27.29 are pending.  Chemistries from 05/29/2011 were entirely normal.  LDH was 204, albumin 4.3, BUN 11, creatinine 0.80.  That was on February 1.  Also on February 1 the CA27.29 had risen to 289 and the CEA 45.8.  There has been a progressive rise in tumor markers going back to the spring of 2012 when the CA27.29 was 127 and the CEA was 18.9.  If we go all the way back to 02/09/2007 the CEA27.29 was 2883 and the CEA was 333.1.  IMAGING STUDIES:  1. CT scan of the chest, abdomen and pelvis with IV contrast on  08/22/2009 showed some improvement in the transpleural spread of  tumor involving the right hemithorax. The right effusion has  decreased in size from the prior exam of 01/31/2007. There is a  decrease in the size of the spiculated right middle lobe lesion.  There is diffuse bone metastases with some interval healing of the  destructive lesion involving the posterior elements of T10. The  right chest wall lesion had not significantly changed in size.  There was a new lesion adjacent to the chest wall lesion measuring  0.9 cm. There were gallstones noted. No focal liver  abnormalities.  2. Nuclear medicine whole bone scan from 08/22/2009 showed diffuse  osseous metastases involving the axillary appendicular skeleton.  3. CT scan of the chest, abdomen and pelvis with IV contrast on  07/30/2010 showed no change in the right chest wall masses, similar  lung nodules, similar right-sided pleural effusion and pleural  thickening and similar osseous metastases. There was no evidence  for extra osseous metastases within the abdomen and pelvis.  Cholelithiasis was present. These scans were compared  with the CT  scans of 08/14/2009.  4. Bone scan from 07/30/2010 showed widespread osseous metastatic  disease.  5. PET scan from 01/13/2011 showed hypermetabolic nodule within the  right chest wall consistent with malignancy. There was adjacent  skin thickening. There was a probable hypermetabolic nodule or  atelectasis at the right lung base. There was a stable right lower  lobe pulmonary nodule. There was widespread metastatic disease in  the skeleton that was unchanged. Several of the sclerotic lesions  were hypermetabolic consistent with active metastatic process.   IMPRESSION AND PLAN:  Elizabeth [name] has had an outstanding response to Femara.  As stated, she has been on this now for about 4 and a half years.  However, over the past 6-12 months there has been evidence of very slow indolent progression.  Our plans are therefore at this point to stop the Femara and start the patient on Faslodex 500 mg IM.  She will receive her first injection today on 07/28/2011, a second injection of 500 mg in 2  weeks which will be April 16 and then we will plan to see the patient again 4 weeks from today around April 30, at which time we will check CBC, chemistries, CEA and CA27.29.  As noted, the patient's last PET scan was on 01/13/2011.  We will consider whether we need to do additional scanning depending on how things go with the change to Faslodex.  The patient will continue with Zometa 3.5 mg IV every 2 months as we have been doing.  These treatments were started on 03/28/2010 after dental clearance.  I would probably continue this until the latter part of this year, and then probably stop the Zometa.  The situation was explained to the patient and her grandson, [name]. Other options for treatment would include Aromasin or possibly adding Afinitor to Femara or some other hormonal agent.  The patient also has never received any chemotherapy and therefore we have a wide range of chemotherapy  options open to [place].  We would probably start with something like Xeloda in a 1 week on 1 week off regimen. As stated, we will be seeing Elizabeth [name] again in 4 weeks for her third injection of Faslodex 500 mg.  Thereafter, she will receive Faslodex injections every 4 weeks.  All of this has been explained to the patient and her grandson, [name].    ______________________________ Samul Dada, [name] DSM/MEDQ  D:  07/28/2011  T:  07/28/2011  Job:  [id number]

## 2011-07-28 NOTE — Telephone Encounter (Signed)
gve the pt her April 2013 appt calendar 

## 2011-07-28 NOTE — Progress Notes (Signed)
Fulvestrant therapy initiated 07/28/11.  Per Dr. Arline Asp, patient will receive injection again on or about 08/11/11 and also 08/25/11.  Fulvestrant ordered on 28 day-day Zoledronic Acid careplan.  RN unable to successfully release Fulvestrant on 07/28/11.  V/O placed.  MD and pharmacy aware.

## 2011-08-03 ENCOUNTER — Encounter: Payer: Self-pay | Admitting: Dietician

## 2011-08-03 ENCOUNTER — Telehealth: Payer: Self-pay | Admitting: Dietician

## 2011-08-03 NOTE — Progress Notes (Signed)
Brief Nutrition Note  Contacted by RN to provide Elizabeth [name] with a case of Ensure due to unintentional weight loss. Spoke with Elizabeth [name] over the phone about unintentional weight loss. She declined nutrition appointment or need for nutrition material via mail. I have encouraged her to eat high-calorie, high protein foods through out the day. I have provided her with one free case of Ensure on 07/30/11.   [facility] [phone]

## 2011-08-11 ENCOUNTER — Ambulatory Visit (HOSPITAL_BASED_OUTPATIENT_CLINIC_OR_DEPARTMENT_OTHER): Payer: Medicare Other

## 2011-08-11 VITALS — BP 133/79 | HR 66 | Temp 97.5°F

## 2011-08-11 DIAGNOSIS — C7951 Secondary malignant neoplasm of bone: Secondary | ICD-10-CM

## 2011-08-11 DIAGNOSIS — C50919 Malignant neoplasm of unspecified site of unspecified female breast: Secondary | ICD-10-CM

## 2011-08-11 MED ORDER — FULVESTRANT 250 MG/5ML IM SOLN
500.0000 mg | Freq: Once | INTRAMUSCULAR | Status: AC
  Administered 2011-08-11: 500 mg via INTRAMUSCULAR
  Filled 2011-08-11: qty 10

## 2011-08-11 MED ORDER — FULVESTRANT 250 MG/5ML IM SOLN: 500.0000 mg | INTRAMUSCULAR | Status: DC

## 2011-08-11 NOTE — Progress Notes (Signed)
Patient complained of dizziness during injection. Patient assisted to the chair and placed into a reclined position. BP noted to be low. Patient remained alert and oriented. Patient given water to drink and reassessed vitals until symptoms resolved and BP WNL. Patient with grandson and assisted to a wheelchair and transported to the lobby after discharge. Patient aware of future appointments.

## 2011-08-24 ENCOUNTER — Telehealth: Payer: Self-pay | Admitting: Oncology

## 2011-08-24 ENCOUNTER — Other Ambulatory Visit: Payer: Self-pay | Admitting: Lab

## 2011-08-24 ENCOUNTER — Encounter: Payer: Self-pay | Admitting: Oncology

## 2011-08-24 ENCOUNTER — Ambulatory Visit (HOSPITAL_BASED_OUTPATIENT_CLINIC_OR_DEPARTMENT_OTHER): Payer: Medicare Other | Admitting: Oncology

## 2011-08-24 VITALS — BP 130/68 | HR 84 | Temp 97.3°F | Ht 64.0 in | Wt 103.6 lb

## 2011-08-24 DIAGNOSIS — C7952 Secondary malignant neoplasm of bone marrow: Secondary | ICD-10-CM

## 2011-08-24 DIAGNOSIS — C50919 Malignant neoplasm of unspecified site of unspecified female breast: Secondary | ICD-10-CM

## 2011-08-24 DIAGNOSIS — C7951 Secondary malignant neoplasm of bone: Secondary | ICD-10-CM

## 2011-08-24 DIAGNOSIS — Z5111 Encounter for antineoplastic chemotherapy: Secondary | ICD-10-CM

## 2011-08-24 LAB — COMPREHENSIVE METABOLIC PANEL
Alkaline Phosphatase: 87 U/L (ref 39–117)
BUN: 22 mg/dL (ref 6–23)
CO2: 28 mEq/L (ref 19–32)
Creatinine, Ser: 0.83 mg/dL (ref 0.50–1.10)
Glucose, Bld: 106 mg/dL — ABNORMAL HIGH (ref 70–99)
Sodium: 141 mEq/L (ref 135–145)
Total Bilirubin: 0.7 mg/dL (ref 0.3–1.2)
Total Protein: 7.2 g/dL (ref 6.0–8.3)

## 2011-08-24 LAB — CBC WITH DIFFERENTIAL/PLATELET
Basophils Absolute: 0 10*3/uL (ref 0.0–0.1)
EOS%: 1.7 % (ref 0.0–7.0)
Eosinophils Absolute: 0.1 10*3/uL (ref 0.0–0.5)
HGB: 11.7 g/dL (ref 11.6–15.9)
LYMPH%: 27.1 % (ref 14.0–49.7)
MCH: 28.3 pg (ref 25.1–34.0)
MCV: 88 fL (ref 79.5–101.0)
MONO%: 11.6 % (ref 0.0–14.0)
NEUT#: 3.3 10*3/uL (ref 1.5–6.5)
NEUT%: 58.8 % (ref 38.4–76.8)
Platelets: 170 10*3/uL (ref 145–400)
RDW: 15.6 % — ABNORMAL HIGH (ref 11.2–14.5)

## 2011-08-24 LAB — CEA: CEA: 49.4 ng/mL — ABNORMAL HIGH (ref 0.0–5.0)

## 2011-08-24 LAB — CANCER ANTIGEN 27.29: CA 27.29: 383 U/mL — ABNORMAL HIGH (ref 0–39)

## 2011-08-24 LAB — LACTATE DEHYDROGENASE: LDH: 193 U/L (ref 94–250)

## 2011-08-24 MED ORDER — FULVESTRANT 250 MG/5ML IM SOLN
500.0000 mg | Freq: Once | INTRAMUSCULAR | Status: AC
  Administered 2011-08-24: 500 mg via INTRAMUSCULAR
  Filled 2011-08-24: qty 10

## 2011-08-24 NOTE — Telephone Encounter (Signed)
Gave pt appt for 09/22/11 lab MD and chemo, emailed Marcelino Duster to schdule Zometa on 09/22/11

## 2011-08-24 NOTE — Progress Notes (Signed)
This office note has been dictated.  #[id number]

## 2011-08-24 NOTE — Progress Notes (Signed)
CC:   Elizabeth [name], [name], [name] Elizabeth [name], [name]   PROBLEM LIST:  1. Metastatic breast cancer originating in the right breast, neglected  primary with diagnosis going back to October 2008. At presentation  the patient had a right trapped lung with pleural disease,  extensive bone metastases and probable liver metastases. Biopsy  showed invasive ductal cancer. Estrogen receptor 91%, progesterone  receptor 1%, HER-2/neu was 1+. At that time the patient was having  fairly severe pain in her back. Also had impending T10 cord  compression. She received radiation treatments to the thoracic  spine from T7-T12 from 02/02/2007 through 02/21/2007. She was  started on Femara 2.5 mg a day on 02/09/2007 and Zometa on  03/28/2010 after receiving dental clearance. The patient was noted  to have progression on her right chest wall in March 2011 and  received radiation treatments to the right chest wall 2000 cGy in  10 fractions from 10/10/2009 through 10/23/2009.  She does have elevated tumor markers,  specifically CEA and CA 27.29. On 02/09/2007, CA27.29 was 2883 and the CEA was 3331.  The patient received Femara 2.5 mg daily from 02/09/2007 through 07/28/2011 initially with a dramatic response but in recent months has had slow progression.  The patient was started on Faslodex 500 mg IM on 07/28/2011. 2. Hearing impairment.  3. Glaucoma.  4. Gallstones evident on CT scan.  5. Weight loss.     MEDICATIONS: 1. Lumigan 0.01% solution 1 drop into left eye at bedtime. 2. Cosopt 1 drop into left eye twice daily. 3. Xalatan 0.005% ophthalmic solution daily. 4. Faslodex 500 mg IM to be given monthly after the initial induction     period during the first month when the patient received injections     every 2 weeks for a total of 3 doses. 5. Zometa 3.5 mg IV every 2 months.  HISTORY:  Elizabeth [name] was seen today for followup of her metastatic breast cancer.  She was last seen by [place] on  07/28/2011.  She is accompanied by her grandson, Elizabeth [name].  Because of slow progression of her disease Femara was stopped at the time of her last visit, and she was started on Faslodex (fulvestrant) 500 mg IM which she received initially on 04/02 and again on 04/16.  She will receive her third dose today and then go on maintenance every 4 weeks.  Elizabeth [name] also receives Zometa 3.5 mg IV every 2 months and this was last given on 04/02.  The patient as usual is without complaints today.  She does admit to picking away at her ulcer on the right chest wall.  She denies any respiratory problems or pain.  PHYSICAL EXAM:  She is a frail 78 year old lady who needs help getting up and down from the examining table.  She ambulates without assistance. Weight today is 103.6 pounds as compared with 105.2 pounds.  Height 5 feet 4 inches.  Body surface area 1.46 meter squared.  Blood pressure 130/68.  Other vital signs are normal.  There are no major changes in her physical exam from that carried out on 07/28/2011.  The ulcerations are certainly no bigger, may be slightly smaller by a few mm.  It will be recalled that there were 2 ulcerations, one rather oval and another one that was more superior and toward the midline.  No new ulcers or nodularity on the chest wall.  Lungs remain clear.  There is no adenopathy.  The abdomen is benign.  There is  no peripheral edema.  The patient does not have a Port-A-Cath or central catheter.  Left breast is benign.  No axillary adenopathy.  No lymphedema of the right arm.  LABORATORY DATA:  Today, white count 5.6, ANC 3.3, hemoglobin 11.7, hematocrit 36.2, platelets 170,000.  Chemistry, CEA and CA27.29 are pending.  Chemistries from 07/28/2011 were completely normal.  CEA however had increased to 55.1 from 45.8 on 05/29/2011.  CA27.29 also had increased up to 397 from 289 on 05/29/2011, but markedly decreased from Values at diagnosis in October 2008.  IMAGING  STUDIES:  1. CT scan of the chest, abdomen and pelvis with IV contrast on  08/22/2009 showed some improvement in the transpleural spread of  tumor involving the right hemithorax. The right effusion has  decreased in size from the prior exam of 01/31/2007. There is a  decrease in the size of the spiculated right middle lobe lesion.  There is diffuse bone metastases with some interval healing of the  destructive lesion involving the posterior elements of T10. The  right chest wall lesion had not significantly changed in size.  There was a new lesion adjacent to the chest wall lesion measuring  0.9 cm. There were gallstones noted. No focal liver  abnormalities.  2. Nuclear medicine whole bone scan from 08/22/2009 showed diffuse  osseous metastases involving the axillary appendicular skeleton.  3. CT scan of the chest, abdomen and pelvis with IV contrast on  07/30/2010 showed no change in the right chest wall masses, similar  lung nodules, similar right-sided pleural effusion and pleural  thickening and similar osseous metastases. There was no evidence  for extra osseous metastases within the abdomen and pelvis.  Cholelithiasis was present. These scans were compared with the CT  scans of 08/14/2009.  4. Bone scan from 07/30/2010 showed widespread osseous metastatic  disease.  5. PET scan from 01/13/2011 showed hypermetabolic nodule within the  right chest wall consistent with malignancy. There was adjacent  skin thickening. There was a probable hypermetabolic nodule or  atelectasis at the right lung base. There was a stable right lower  lobe pulmonary nodule. There was widespread metastatic disease in  the skeleton that was unchanged. Several of the sclerotic lesions  were hypermetabolic consistent with active metastatic process.   IMPRESSION AND PLAN:  Elizabeth [name] was started on Faslodex just a month ago.  She will receive another 500 mg IM, this being injection #4.  She is tolerating  these okay.  She will return in approximately 4 weeks, at which time she will be due for another intramuscular injection of Faslodex and will also be due for another dose of Zometa 3.5 mg IV.  We will check CBC, chemistries, CEA and CA27.29.  The patient will have an appointment to see me at that time.    ______________________________ Samul Dada, [name] DSM/MEDQ  D:  08/24/2011  T:  08/24/2011  Job:  [id number]

## 2011-08-25 ENCOUNTER — Telehealth: Payer: Self-pay | Admitting: *Deleted

## 2011-08-25 NOTE — Telephone Encounter (Signed)
Per staff message form Okey Dupre, I have schedule treatment for the patient to follow the MD visit on 5/28. Rose aware appt in computer.  JMW

## 2011-08-26 ENCOUNTER — Telehealth: Payer: Self-pay | Admitting: Oncology

## 2011-08-26 NOTE — Telephone Encounter (Signed)
Called pt ,left message regarding, appt for may 28th , lab MD and chemo

## 2011-09-16 ENCOUNTER — Encounter: Payer: Self-pay | Admitting: Dietician

## 2011-09-16 NOTE — Progress Notes (Signed)
Brief Out-patient Oncology Nutrition Note  Reason: Positive nutrition risk screen for unintentional weight loss > 10 lb over 1 month and decrease appetite  Elizabeth [name] is an 78 year old patient of Dr. Arline Asp, diagnosed with breast cancer with mets to bone. Contacted patient via home telephone number for positive nutrition risk. She reported her appetite is fine. She stated she eats 3 meals and drinks 1 Ensure nutrition supplement daily. She denies any problems with nausea and vomiting.   Height: 64" Weight: 103.6 lb. BMI: 17.7 kg/ m^2 (Underweight)  Wt Readings from Last 10 Encounters:  08/24/11 103 lb 9.6 oz (46.993 kg)  07/28/11 105 lb 3.2 oz (47.718 kg)  05/29/11 105 lb 9.6 oz (47.9 kg)  04/20/11 107 lb 11.2 oz (48.852 kg)  03/25/11 109 lb 12.8 oz (49.805 kg)  12/02/10 109 lb 2 oz (49.499 kg)  *Patient's weight has been trending down.  Patient declines nutrition appointment. Patient declines to receive nutrition information via mail. I have encouraged the patient to consume 4 to 6 small meals daily to increase intake. I have also encouraged the patient to continue to  consume 1 Ensure daily and when intake is poor. Patient received one free case of Ensure from cancer center one month ago. We discussed strategies to increase caloric and protein intake. Patient is without any nutrition related questions at this time.   RD available for nutrition needs.  Elizabeth [name] [facility] [phone]

## 2011-09-22 ENCOUNTER — Ambulatory Visit (HOSPITAL_BASED_OUTPATIENT_CLINIC_OR_DEPARTMENT_OTHER): Payer: Medicare Other

## 2011-09-22 ENCOUNTER — Other Ambulatory Visit (HOSPITAL_BASED_OUTPATIENT_CLINIC_OR_DEPARTMENT_OTHER): Payer: Medicare Other | Admitting: Lab

## 2011-09-22 ENCOUNTER — Ambulatory Visit (HOSPITAL_BASED_OUTPATIENT_CLINIC_OR_DEPARTMENT_OTHER): Payer: Medicare Other | Admitting: Oncology

## 2011-09-22 ENCOUNTER — Telehealth: Payer: Self-pay | Admitting: Oncology

## 2011-09-22 ENCOUNTER — Encounter: Payer: Self-pay | Admitting: Oncology

## 2011-09-22 VITALS — BP 126/69 | HR 62 | Temp 96.9°F | Ht 64.0 in | Wt 104.6 lb

## 2011-09-22 DIAGNOSIS — C50919 Malignant neoplasm of unspecified site of unspecified female breast: Secondary | ICD-10-CM

## 2011-09-22 DIAGNOSIS — C7951 Secondary malignant neoplasm of bone: Secondary | ICD-10-CM

## 2011-09-22 DIAGNOSIS — Z5111 Encounter for antineoplastic chemotherapy: Secondary | ICD-10-CM

## 2011-09-22 LAB — CBC WITH DIFFERENTIAL/PLATELET
BASO%: 0.7 % (ref 0.0–2.0)
EOS%: 4.3 % (ref 0.0–7.0)
HCT: 34.7 % — ABNORMAL LOW (ref 34.8–46.6)
MCH: 28.5 pg (ref 25.1–34.0)
MCHC: 33 g/dL (ref 31.5–36.0)
MCV: 86.3 fL (ref 79.5–101.0)
MONO%: 14 % (ref 0.0–14.0)
NEUT%: 60.1 % (ref 38.4–76.8)
lymph#: 0.9 10*3/uL (ref 0.9–3.3)

## 2011-09-22 LAB — COMPREHENSIVE METABOLIC PANEL
AST: 23 U/L (ref 0–37)
Alkaline Phosphatase: 72 U/L (ref 39–117)
BUN: 8 mg/dL (ref 6–23)
Creatinine, Ser: 0.73 mg/dL (ref 0.50–1.10)
Glucose, Bld: 89 mg/dL (ref 70–99)
Potassium: 3.8 mEq/L (ref 3.5–5.3)
Total Bilirubin: 0.8 mg/dL (ref 0.3–1.2)

## 2011-09-22 LAB — CEA: CEA: 42 ng/mL — ABNORMAL HIGH (ref 0.0–5.0)

## 2011-09-22 LAB — CANCER ANTIGEN 27.29: CA 27.29: 299 U/mL — ABNORMAL HIGH (ref 0–39)

## 2011-09-22 MED ORDER — SODIUM CHLORIDE 0.9 % IV SOLN
3.5000 mg | Freq: Once | INTRAVENOUS | Status: AC
  Administered 2011-09-22: 3.5 mg via INTRAVENOUS
  Filled 2011-09-22: qty 4.38

## 2011-09-22 MED ORDER — SODIUM CHLORIDE 0.9 % IV SOLN
Freq: Once | INTRAVENOUS | Status: AC
  Administered 2011-09-22: 11:00:00 via INTRAVENOUS

## 2011-09-22 MED ORDER — FULVESTRANT 250 MG/5ML IM SOLN
500.0000 mg | Freq: Once | INTRAMUSCULAR | Status: AC
  Administered 2011-09-22: 500 mg via INTRAMUSCULAR
  Filled 2011-09-22: qty 10

## 2011-09-22 NOTE — Progress Notes (Signed)
CC:   Elizabeth [name], [name], [name] Elizabeth [name], [name]  PROBLEM LIST:  1. Metastatic breast cancer originating in the right breast, neglected  primary with diagnosis going back to October 2008. At presentation  the patient had a right trapped lung with pleural disease,  extensive bone metastases and probable liver metastases. Biopsy  showed invasive ductal cancer. Estrogen receptor 91%, progesterone  receptor 1%, HER-2/neu was 1+. At that time the patient was having  fairly severe pain in her back. Also had impending T10 cord  compression. She received radiation treatments to the thoracic  spine from T7-T12 from 02/02/2007 through 02/21/2007. She was  started on Femara 2.5 mg a day on 02/09/2007 and Zometa on  03/28/2010 after receiving dental clearance. The patient was noted  to have progression on her right chest wall in March 2011 and  received radiation treatments to the right chest wall 2000 cGy in  10 fractions from 10/10/2009 through 10/23/2009.  She does have elevated tumor markers,  specifically CEA and CA 27.29. On 02/09/2007, CA27.29 was 2883 and the CEA was 3331.  The patient received Femara 2.5 mg daily from 02/09/2007 through  07/28/2011 initially with a dramatic response but in recent months  has had slow progression. The patient was started on Faslodex 500  mg IM on 07/28/2011.  2. Hearing impairment.  3. Glaucoma.  4. Gallstones evident on CT scan.  5. Weight loss.    MEDICATIONS:  1. Lumigan 0.01% solution 1 drop into left eye at bedtime.  2. Cosopt 1 drop into left eye twice daily.  3. Xalatan 0.005% ophthalmic solution daily.  4. Faslodex 500 mg IM to be given monthly after the initial induction  period during the first month when the patient received injections  every 2 weeks for a total of 3 doses.  5. Zometa 3.5 mg IV every 2 months. 6. Multivitamins 1 a day.  HISTORY:  I saw Elizabeth [name] today for followup of her metastatic breast cancer.  She was  last seen by [place] on 08/24/2011.  She is accompanied by her grandson Elizabeth [name].  The patient is here today for another dose of IM Faslodex dose number 4.  She is also here for Zometa 3.5 mg IV.  She receives Zometa every 2 months.  There has been no change in her condition.  She is without complaints. She likes to work in her yard.  She denies any respiratory problems or pain.  PHYSICAL EXAM:  The patient remains a frail 78 year old woman needs some help getting up and down from the examining table.  She ambulates without assistance.  Weight today is 104.6 pounds, height 5 feet 4 inches, body surface area .46 sq/m.  Blood pressure 126/69.  Other vital signs are normal.  There is no scleral icterus.  Mouth and pharynx are benign.  There is no peripheral adenopathy palpable.  Heart/Lungs: Normal.  Right chest wall reveals 1 ulcer.  The larger ulcer measures 2 cm across x 4 cm in length.  This really has not changed.  There is a shallow ulcer with a clean base.  The ulcer that was superior and medial to this appears to have healed.  I think there is less nodularity over the right chest wall.  Left breast is benign.  No axillary adenopathy. Abdomen:  Benign with no organomegaly or masses palpable.  No peripheral edema.  No obvious lymphedema of the right arm.  The patient does not have a Port-A-Cath or central catheter.  Neurologic:  Nonfocal.  LABORATORY DATA:  Today, white count 4.4, ANC 2.6, hemoglobin 11.5, hematocrit 34.7, platelets 189,000.  Chemistries and tumor markers are pending.  Chemistries from 08/24/2011 were essentially normal.  BUN was 22, creatinine 0.83.  CEA 49.4, down from 55.1 on 07/28/2011.  CA 27.29 was 383 down from 397  on 07/28/2011.  IMAGING STUDIES:  1. CT scan of the chest, abdomen and pelvis with IV contrast on  08/22/2009 showed some improvement in the transpleural spread of  tumor involving the right hemithorax. The right effusion has  decreased in size from  the prior exam of 01/31/2007. There is a  decrease in the size of the spiculated right middle lobe lesion.  There is diffuse bone metastases with some interval healing of the  destructive lesion involving the posterior elements of T10. The  right chest wall lesion had not significantly changed in size.  There was a new lesion adjacent to the chest wall lesion measuring  0.9 cm. There were gallstones noted. No focal liver  abnormalities.  2. Nuclear medicine whole bone scan from 08/22/2009 showed diffuse  osseous metastases involving the axillary appendicular skeleton.  3. CT scan of the chest, abdomen and pelvis with IV contrast on  07/30/2010 showed no change in the right chest wall masses, similar  lung nodules, similar right-sided pleural effusion and pleural  thickening and similar osseous metastases. There was no evidence  for extra osseous metastases within the abdomen and pelvis.  Cholelithiasis was present. These scans were compared with the CT  scans of 08/14/2009.  4. Bone scan from 07/30/2010 showed widespread osseous metastatic  disease.  5. PET scan from 01/13/2011 showed hypermetabolic nodule within the  right chest wall consistent with malignancy. There was adjacent  skin thickening. There was a probable hypermetabolic nodule or  atelectasis at the right lung base. There was a stable right lower  lobe pulmonary nodule. There was widespread metastatic disease in  the skeleton that was unchanged. Several of the sclerotic lesions  were hypermetabolic consistent with active metastatic process.   IMPRESSION AND PLAN:  Elizabeth [name] is here today for another dose of Faslodex 500 mg IM.  This will be her 4th dose of Faslodex.  She is also here for another dose of Zometa 3.5 mg IV.  We give the Zometa every 2 months.  At the moment, I think the patient's disease is more or less stable. Tumor markers decreased somewhat, and 1 of the ulcers has healed over. On the other hand,  the larger ulcer remains unchanged.  At this point, it is too early to really say whether the patient is responding to Faslodex.  It will be recalled that these treatments were started approximately 2 months ago on April 2nd.  Will plan to see Mrs. Hineman again in 4 weeks which should be around June 25th at which time we will check CBC, chemistries, LDH, CEA and CA 27.29.  She will also be due for another dose of Faslodex 500 mg IM. Her next visit after June 25th will be July 23rd when she is scheduled to receive both Faslodex and IV Zometa.  It will be recalled that the patient's last imaging study was a PET scan from 01/13/2011.  We may want to repeat some imaging studies within the next few months.    ______________________________ Samul Dada, [name] DSM/MEDQ  D:  09/22/2011  T:  09/22/2011  Job:  [id number]

## 2011-09-22 NOTE — Progress Notes (Signed)
This office note has been dictated.  #[id number]

## 2011-09-22 NOTE — Telephone Encounter (Signed)
Gv pt appt for [id number] 

## 2011-09-24 ENCOUNTER — Ambulatory Visit: Payer: Self-pay | Admitting: Oncology

## 2011-09-24 ENCOUNTER — Other Ambulatory Visit: Payer: Self-pay

## 2011-10-20 ENCOUNTER — Other Ambulatory Visit (HOSPITAL_BASED_OUTPATIENT_CLINIC_OR_DEPARTMENT_OTHER): Payer: Medicare Other | Admitting: Lab

## 2011-10-20 ENCOUNTER — Encounter: Payer: Self-pay | Admitting: Oncology

## 2011-10-20 ENCOUNTER — Ambulatory Visit (HOSPITAL_BASED_OUTPATIENT_CLINIC_OR_DEPARTMENT_OTHER): Payer: Medicare Other | Admitting: Oncology

## 2011-10-20 VITALS — BP 132/72 | HR 81 | Temp 97.6°F | Ht 64.0 in | Wt 101.2 lb

## 2011-10-20 DIAGNOSIS — C50919 Malignant neoplasm of unspecified site of unspecified female breast: Secondary | ICD-10-CM

## 2011-10-20 DIAGNOSIS — C7951 Secondary malignant neoplasm of bone: Secondary | ICD-10-CM

## 2011-10-20 DIAGNOSIS — C782 Secondary malignant neoplasm of pleura: Secondary | ICD-10-CM

## 2011-10-20 DIAGNOSIS — C7800 Secondary malignant neoplasm of unspecified lung: Secondary | ICD-10-CM

## 2011-10-20 DIAGNOSIS — C7952 Secondary malignant neoplasm of bone marrow: Secondary | ICD-10-CM

## 2011-10-20 DIAGNOSIS — Z5111 Encounter for antineoplastic chemotherapy: Secondary | ICD-10-CM

## 2011-10-20 LAB — CBC WITH DIFFERENTIAL/PLATELET
EOS%: 5 % (ref 0.0–7.0)
Eosinophils Absolute: 0.3 10*3/uL (ref 0.0–0.5)
MCV: 86 fL (ref 79.5–101.0)
MONO%: 12.4 % (ref 0.0–14.0)
NEUT#: 3 10*3/uL (ref 1.5–6.5)
RBC: 3.86 10*6/uL (ref 3.70–5.45)
RDW: 15.4 % — ABNORMAL HIGH (ref 11.2–14.5)
lymph#: 1.2 10*3/uL (ref 0.9–3.3)

## 2011-10-20 LAB — COMPREHENSIVE METABOLIC PANEL
AST: 22 U/L (ref 0–37)
Albumin: 4 g/dL (ref 3.5–5.2)
Alkaline Phosphatase: 64 U/L (ref 39–117)
Glucose, Bld: 83 mg/dL (ref 70–99)
Potassium: 4.1 mEq/L (ref 3.5–5.3)
Sodium: 140 mEq/L (ref 135–145)
Total Protein: 7 g/dL (ref 6.0–8.3)

## 2011-10-20 MED ORDER — FULVESTRANT 250 MG/5ML IM SOLN
500.0000 mg | Freq: Once | INTRAMUSCULAR | Status: AC
  Administered 2011-10-20: 500 mg via INTRAMUSCULAR
  Filled 2011-10-20: qty 10

## 2011-10-20 MED ORDER — HEPARIN SOD (PORK) LOCK FLUSH 100 UNIT/ML IV SOLN
500.0000 [IU] | Freq: Once | INTRAVENOUS | Status: DC | PRN
  Filled 2011-10-20: qty 5

## 2011-10-20 NOTE — Progress Notes (Signed)
CC:   [name], [name], [name] [name], [name]   PROBLEM LIST:  1. Metastatic breast cancer originating in the right breast, neglected  primary with diagnosis going back to October 2008. At presentation  the patient had a right trapped lung with pleural disease,  extensive bone metastases and probable liver metastases. Biopsy  showed invasive ductal cancer. Estrogen receptor 91%, progesterone  receptor 1%, HER-2/neu was 1+. At that time the patient was having  fairly severe pain in her back. Also had impending T10 cord  compression. She received radiation treatments to the thoracic  spine from T7-T12 from 02/02/2007 through 02/21/2007. She was  started on Femara 2.5 mg a day on 02/09/2007 and Zometa on  03/28/2010 after receiving dental clearance. The patient was noted  to have progression on her right chest wall in March 2011 and  received radiation treatments to the right chest wall 2000 cGy in  10 fractions from 10/10/2009 through 10/23/2009.  She does have elevated tumor markers,  specifically CEA and CA 27.29. On 02/09/2007, CA27.29 was 2883 and the CEA was 3331.  The patient received Femara 2.5 mg daily from 02/09/2007 through  07/28/2011 initially with a dramatic response but in recent months  has had slow progression. The patient was started on Faslodex 500  mg IM on 07/28/2011.   2. Hearing impairment.  3. Glaucoma.  4. Gallstones evident on CT scan.  5. Weight loss.    MEDICATIONS:  1. Lumigan 0.01% solution 1 drop into left eye at bedtime.  2. Cosopt 1 drop into left eye twice daily.  3. Xalatan 0.005% ophthalmic solution daily.  4. Faslodex 500 mg IM to be given monthly after the initial induction  period during the first month when the patient received injections  every 2 weeks for a total of 3 doses.  5. Zometa 3.5 mg IV every 2 months. Zometa was started on 03/28/2010. 6. Multivitamins 1 a day.   HISTORY:  Elizabeth [name] was seen today for followup  of her metastatic breast cancer.  She was last seen by [place] on 09/22/2011.  She is accompanied by her grandson, [name].  The patient denies any changes in her condition.  She says she takes in 1 can of Ensure per day.  We have tried to encourage her to take in more in view of her weight loss.  I think expense is an issue for her.  We will try to obtain some Ensure from our nutritional specialist.  The patient is without complaints today.  PHYSICAL EXAMINATION:  There is little change, except for a 2 pounds weight loss.  Today's weight is 101.2 pounds, height 5 feet 4 inches, body surface area 1.44 sq m. Blood pressure 132/72.  Other vital signs are normal.  The physical exam is essentially unchanged.  The right chest wall reveals an ulcer that today measures 3.5 cm across by about 3 cm in width.  The smaller ulcer that had been present, more superiorly and medial to this larger ulcer, seems to have healed over.  No adenopathy present.  Left breast is benign.  Heart and lungs are normal. Abdomen:  Benign.  Extremities:  No peripheral edema.  No obvious lymphedema.  The patient does not have a Port-A-Cath or central catheter.  Neurologic was nonfocal.  The patient needs some help getting up and down from the examining table.  LABORATORY DATA:  Today, white count 5.1, ANC 3.0, hemoglobin 10.8, hematocrit 33.2, platelets 208,000.  Chemistries, CEA and CA27.29  are pending.  Chemistries from 09/22/2011 were essentially normal except for a calcium of 8.3.  Albumin was 4.0.  CEA was 42.0 down from 49.4 on 04/29 and 55.1 on 07/28/2011. CA27.29 was 299 down from 383 on 04/29 and 397 on 07/28/2011.   IMAGING STUDIES:  1. CT scan of the chest, abdomen and pelvis with IV contrast on  08/22/2009 showed some improvement in the transpleural spread of  tumor involving the right hemithorax. The right effusion has  decreased in size from the prior exam of 01/31/2007. There is a  decrease in the size of  the spiculated right middle lobe lesion.  There is diffuse bone metastases with some interval healing of the  destructive lesion involving the posterior elements of T10. The  right chest wall lesion had not significantly changed in size.  There was a new lesion adjacent to the chest wall lesion measuring  0.9 cm. There were gallstones noted. No focal liver  abnormalities.  2. Nuclear medicine whole bone scan from 08/22/2009 showed diffuse  osseous metastases involving the axillary appendicular skeleton.  3. CT scan of the chest, abdomen and pelvis with IV contrast on  07/30/2010 showed no change in the right chest wall masses, similar  lung nodules, similar right-sided pleural effusion and pleural  thickening and similar osseous metastases. There was no evidence  for extra osseous metastases within the abdomen and pelvis.  Cholelithiasis was present. These scans were compared with the CT  scans of 08/14/2009.  4. Bone scan from 07/30/2010 showed widespread osseous metastatic  disease.  5. PET scan from 01/13/2011 showed hypermetabolic nodule within the  right chest wall consistent with malignancy. There was adjacent  skin thickening. There was a probable hypermetabolic nodule or  atelectasis at the right lung base. There was a stable right lower  lobe pulmonary nodule. There was widespread metastatic disease in  the skeleton that was unchanged. Several of the sclerotic lesions  were hypermetabolic consistent with active metastatic process.   IMPRESSION AND PLAN:  [name] condition seems to be stable.  She is here for another dose of Faslodex 500 mg IM.  This will be dose #5. I am encouraged by the steady drop in her tumor markers since we started Faslodex on 07/28/2011. The patient will be seen again in 4 weeks, on or about July 23rd, at which time she will be due for another dose of IM Faslodex and another dose of IV Zometa 3.5 mg.  Zometa is being given every 2 months.  I  note that we had started Zometa in December of 2011.  We may want to hold up on further Zometa after 2 years of treatment which will be December 2013.  Also of note is the patient's most recent PET scan carried out on 01/13/2011.  We may want to repeat imaging studies at some point, before the end of the year.  If the patient is doing well after her visit on July 23rd, we may try to see her for office visits every 2 months while continuing the monthly Faslodex shots.    ______________________________ Samul Dada, [name] DSM/MEDQ  D:  10/20/2011  T:  10/20/2011  Job:  [id number]

## 2011-10-20 NOTE — Progress Notes (Signed)
This office note has been dictated.  #[id number]

## 2011-10-21 ENCOUNTER — Telehealth: Payer: Self-pay | Admitting: *Deleted

## 2011-10-21 ENCOUNTER — Telehealth: Payer: Self-pay | Admitting: Oncology

## 2011-10-21 NOTE — Telephone Encounter (Signed)
called pt and provided appt for 07/17

## 2011-10-21 NOTE — Telephone Encounter (Signed)
Per staff message I have scheduled appt. JMW 

## 2011-11-11 ENCOUNTER — Ambulatory Visit (HOSPITAL_BASED_OUTPATIENT_CLINIC_OR_DEPARTMENT_OTHER): Payer: Medicare Other

## 2011-11-11 ENCOUNTER — Ambulatory Visit (HOSPITAL_BASED_OUTPATIENT_CLINIC_OR_DEPARTMENT_OTHER): Payer: Medicare Other | Admitting: Oncology

## 2011-11-11 ENCOUNTER — Encounter: Payer: Self-pay | Admitting: Oncology

## 2011-11-11 ENCOUNTER — Telehealth: Payer: Self-pay | Admitting: Oncology

## 2011-11-11 ENCOUNTER — Encounter: Payer: Self-pay | Admitting: Nutrition

## 2011-11-11 ENCOUNTER — Other Ambulatory Visit (HOSPITAL_BASED_OUTPATIENT_CLINIC_OR_DEPARTMENT_OTHER): Payer: Medicare Other | Admitting: Lab

## 2011-11-11 ENCOUNTER — Other Ambulatory Visit: Payer: Self-pay | Admitting: Oncology

## 2011-11-11 VITALS — BP 137/71 | HR 99 | Temp 97.3°F | Ht 64.0 in | Wt 99.7 lb

## 2011-11-11 DIAGNOSIS — C7951 Secondary malignant neoplasm of bone: Secondary | ICD-10-CM

## 2011-11-11 DIAGNOSIS — C7800 Secondary malignant neoplasm of unspecified lung: Secondary | ICD-10-CM

## 2011-11-11 DIAGNOSIS — C7952 Secondary malignant neoplasm of bone marrow: Secondary | ICD-10-CM

## 2011-11-11 DIAGNOSIS — C782 Secondary malignant neoplasm of pleura: Secondary | ICD-10-CM

## 2011-11-11 DIAGNOSIS — C50919 Malignant neoplasm of unspecified site of unspecified female breast: Secondary | ICD-10-CM

## 2011-11-11 DIAGNOSIS — Z5111 Encounter for antineoplastic chemotherapy: Secondary | ICD-10-CM

## 2011-11-11 LAB — COMPREHENSIVE METABOLIC PANEL
AST: 25 U/L (ref 0–37)
Albumin: 3.9 g/dL (ref 3.5–5.2)
Alkaline Phosphatase: 72 U/L (ref 39–117)
CO2: 25 mEq/L (ref 19–32)
Chloride: 105 mEq/L (ref 96–112)
Creatinine, Ser: 0.71 mg/dL (ref 0.50–1.10)
Glucose, Bld: 103 mg/dL — ABNORMAL HIGH (ref 70–99)

## 2011-11-11 LAB — CBC WITH DIFFERENTIAL/PLATELET
BASO%: 0.5 % (ref 0.0–2.0)
Basophils Absolute: 0 10*3/uL (ref 0.0–0.1)
Eosinophils Absolute: 0.1 10*3/uL (ref 0.0–0.5)
HCT: 34.8 % (ref 34.8–46.6)
HGB: 11.3 g/dL — ABNORMAL LOW (ref 11.6–15.9)
MCHC: 32.5 g/dL (ref 31.5–36.0)
MONO#: 0.8 10*3/uL (ref 0.1–0.9)
NEUT#: 4.3 10*3/uL (ref 1.5–6.5)
NEUT%: 65.8 % (ref 38.4–76.8)
Platelets: 182 10*3/uL (ref 145–400)
WBC: 6.5 10*3/uL (ref 3.9–10.3)
lymph#: 1.3 10*3/uL (ref 0.9–3.3)

## 2011-11-11 LAB — CANCER ANTIGEN 27.29: CA 27.29: 287 U/mL — ABNORMAL HIGH (ref 0–39)

## 2011-11-11 MED ORDER — FULVESTRANT 250 MG/5ML IM SOLN
500.0000 mg | Freq: Once | INTRAMUSCULAR | Status: AC
  Administered 2011-11-11: 500 mg via INTRAMUSCULAR
  Filled 2011-11-11: qty 10

## 2011-11-11 MED ORDER — ZOLEDRONIC ACID 4 MG/5ML IV CONC
3.5000 mg | Freq: Once | INTRAVENOUS | Status: AC
  Administered 2011-11-11: 3.5 mg via INTRAVENOUS
  Filled 2011-11-11: qty 4.38

## 2011-11-11 NOTE — Progress Notes (Signed)
Patient continues to lose weight.  She is requesting additional Ensure Plus.  I have provided patient's 2nd case of Ensure Plus.

## 2011-11-11 NOTE — Progress Notes (Signed)
This office note has been dictated.  #[id number]

## 2011-11-11 NOTE — Patient Instructions (Addendum)
What is this medicine? ZOLEDRONIC ACID (ZOE le dron ik AS id) lowers the amount of calcium loss from bone. It is used to treat too much calcium in your blood from cancer. It is also used to prevent complications of cancer that has spread to the bone. This medicine may be used for other purposes; ask your health care provider or pharmacist if you have questions. What should I tell my health care provider before I take this medicine? They need to know if you have any of these conditions: -aspirin-sensitive asthma -dental disease -kidney disease -an unusual or allergic reaction to zoledronic acid, other medicines, foods, dyes, or preservatives -pregnant or trying to get pregnant -breast-feeding How should I use this medicine? This medicine is for infusion into a vein. It is given by a health care professional in a hospital or clinic setting. Talk to your pediatrician regarding the use of this medicine in children. Special care may be needed. Overdosage: If you think you have taken too much of this medicine contact a poison control center or emergency room at once. NOTE: This medicine is only for you. Do not share this medicine with others. What if I miss a dose? It is important not to miss your dose. Call your doctor or health care professional if you are unable to keep an appointment. What may interact with this medicine? -certain antibiotics given by injection -NSAIDs, medicines for pain and inflammation, like ibuprofen or naproxen -some diuretics like bumetanide, furosemide -teriparatide -thalidomide This list may not describe all possible interactions. Give your health care provider a list of all the medicines, herbs, non-prescription drugs, or dietary supplements you use. Also tell them if you smoke, drink alcohol, or use illegal drugs. Some items may interact with your medicine. What should I watch for while using this medicine? Visit your doctor or health care professional for regular  checkups. It may be some time before you see the benefit from this medicine. Do not stop taking your medicine unless your doctor tells you to. Your doctor may order blood tests or other tests to see how you are doing. Women should inform their doctor if they wish to become pregnant or think they might be pregnant. There is a potential for serious side effects to an unborn child. Talk to your health care professional or pharmacist for more information. You should make sure that you get enough calcium and vitamin D while you are taking this medicine. Discuss the foods you eat and the vitamins you take with your health care professional. Some people who take this medicine have severe bone, joint, and/or muscle pain. This medicine may also increase your risk for a broken thigh bone. Tell your doctor right away if you have pain in your upper leg or groin. Tell your doctor if you have any pain that does not go away or that gets worse. What side effects may I notice from receiving this medicine? Side effects that you should report to your doctor or health care professional as soon as possible: -allergic reactions like skin rash, itching or hives, swelling of the face, lips, or tongue -anxiety, confusion, or depression -breathing problems -changes in vision -feeling faint or lightheaded, falls -jaw burning, cramping, pain -muscle cramps, stiffness, or weakness -trouble passing urine or change in the amount of urine Side effects that usually do not require medical attention (report to your doctor or health care professional if they continue or are bothersome): -bone, joint, or muscle pain -fever -hair loss -irritation at  site where injected -loss of appetite -nausea, vomiting -stomach upset -tired This list may not describe all possible side effects. Call your doctor for medical advice about side effects. You may report side effects to FDA at 1-800-FDA-1088. Where should I keep my medicine? This drug  is given in a hospital or clinic and will not be stored at home. NOTE: This sheet is a summary. It may not cover all possible information. If you have questions about this medicine, talk to your doctor, pharmacist, or health care provider.  2012, Elsevier/Gold Standard. (10/10/2010 9:06:58 AM)Fulvestrant injection What is this medicine? FULVESTRANT (ful VES trant) blocks the effects of estrogen. It is used to treat breast cancer in women past the age of menopause. This medicine may be used for other purposes; ask your health care provider or pharmacist if you have questions. What should I tell my health care provider before I take this medicine? They need to know if you have any of these conditions: -bleeding problems -liver disease -low levels of platelets in the blood -an unusual or allergic reaction to fulvestrant, other medicines, foods, dyes, or preservatives -pregnant or trying to get pregnant -breast-feeding How should I use this medicine? This medicine is for injection into a muscle. It is usually given by a health care professional in a hospital or clinic setting. Talk to your pediatrician regarding the use of this medicine in children. Special care may be needed. Overdosage: If you think you have taken too much of this medicine contact a poison control center or emergency room at once. NOTE: This medicine is only for you. Do not share this medicine with others. What if I miss a dose? It is important not to miss your dose. Call your doctor or health care professional if you are unable to keep an appointment. What may interact with this medicine? -medicines that treat or prevent blood clots like warfarin, enoxaparin, and dalteparin This list may not describe all possible interactions. Give your health care provider a list of all the medicines, herbs, non-prescription drugs, or dietary supplements you use. Also tell them if you smoke, drink alcohol, or use illegal drugs. Some items may  interact with your medicine. What should I watch for while using this medicine? Your condition will be monitored carefully while you are receiving this medicine. You will need important blood work done while you are taking this medicine. Do not become pregnant while taking this medicine. Women should inform their doctor if they wish to become pregnant or think they might be pregnant. There is a potential for serious side effects to an unborn child. Talk to your health care professional or pharmacist for more information. What side effects may I notice from receiving this medicine? Side effects that you should report to your doctor or health care professional as soon as possible: -allergic reactions like skin rash, itching or hives, swelling of the face, lips, or tongue -feeling faint or lightheaded, falls -fever or flu-like symptoms -sore throat -vaginal bleeding Side effects that usually do not require medical attention (report to your doctor or health care professional if they continue or are bothersome): -aches, pains -constipation or diarrhea -headache -hot flashes -nausea, vomiting -pain at site where injected -stomach pain This list may not describe all possible side effects. Call your doctor for medical advice about side effects. You may report side effects to FDA at 1-800-FDA-1088. Where should I keep my medicine? This drug is given in a hospital or clinic and will not be stored at  home. NOTE: This sheet is a summary. It may not cover all possible information. If you have questions about this medicine, talk to your doctor, pharmacist, or health care provider.  2012, Elsevier/Gold Standard. (08/22/2007 3:39:24 PM)   FULVESTRANT (ful VES trant) blocks the effects of estrogen. It is used to treat breast cancer in women past the age of menopause. This medicine may be used for other purposes; ask your health care provider or pharmacist if you have questions. What should I tell my  health care provider before I take this medicine? They need to know if you have any of these conditions: -bleeding problems -liver disease -low levels of platelets in the blood -an unusual or allergic reaction to fulvestrant, other medicines, foods, dyes, or preservatives -pregnant or trying to get pregnant -breast-feeding How should I use this medicine? This medicine is for injection into a muscle. It is usually given by a health care professional in a hospital or clinic setting. Talk to your pediatrician regarding the use of this medicine in children. Special care may be needed. Overdosage: If you think you have taken too much of this medicine contact a poison control center or emergency room at once. NOTE: This medicine is only for you. Do not share this medicine with others. What if I miss a dose? It is important not to miss your dose. Call your doctor or health care professional if you are unable to keep an appointment. What may interact with this medicine? -medicines that treat or prevent blood clots like warfarin, enoxaparin, and dalteparin This list may not describe all possible interactions. Give your health care provider a list of all the medicines, herbs, non-prescription drugs, or dietary supplements you use. Also tell them if you smoke, drink alcohol, or use illegal drugs. Some items may interact with your medicine. What should I watch for while using this medicine? Your condition will be monitored carefully while you are receiving this medicine. You will need important blood work done while you are taking this medicine. Do not become pregnant while taking this medicine. Women should inform their doctor if they wish to become pregnant or think they might be pregnant. There is a potential for serious side effects to an unborn child. Talk to your health care professional or pharmacist for more information. What side effects may I notice from receiving this medicine? Side effects that  you should report to your doctor or health care professional as soon as possible: -allergic reactions like skin rash, itching or hives, swelling of the face, lips, or tongue -feeling faint or lightheaded, falls -fever or flu-like symptoms -sore throat -vaginal bleeding Side effects that usually do not require medical attention (report to your doctor or health care professional if they continue or are bothersome): -aches, pains -constipation or diarrhea -headache -hot flashes -nausea, vomiting -pain at site where injected -stomach pain This list may not describe all possible side effects. Call your doctor for medical advice about side effects. You may report side effects to FDA at 1-800-FDA-1088. Where should I keep my medicine? This drug is given in a hospital or clinic and will not be stored at home. NOTE: This sheet is a summary. It may not cover all possible information. If you have questions about this medicine, talk to your doctor, pharmacist, or health care provider.  2012, Elsevier/Gold Standard. (08/22/2007 3:39:24 PM)

## 2011-11-11 NOTE — Progress Notes (Signed)
CC:   [name], [name], [name] [name], [name]   PROBLEM LIST:  1. Metastatic breast cancer originating in the right breast, neglected  primary with diagnosis going back to October 2008. At presentation  the patient had a right trapped lung with pleural disease,  extensive bone metastases and probable liver metastases. Biopsy  showed invasive ductal cancer. Estrogen receptor 91%, progesterone  receptor 1%, HER-2/neu was 1+. At that time the patient was having  fairly severe pain in her back. Also had impending T10 cord  compression. She received radiation treatments to the thoracic  spine from T7-T12 from 02/02/2007 through 02/21/2007. She was  started on Femara 2.5 mg a day on 02/09/2007 and Zometa on  03/28/2010 after receiving dental clearance. The patient was noted  to have progression on her right chest wall in March 2011 and  received radiation treatments to the right chest wall 2000 cGy in  10 fractions from 10/10/2009 through 10/23/2009.  She does have elevated tumor markers,  specifically CEA and CA 27.29. On 02/09/2007, CA27.29 was 2883 and the CEA was 3331.  The patient received Femara 2.5 mg daily from 02/09/2007 through  07/28/2011 initially with a dramatic response but in recent months  has had slow progression. The patient was started on Faslodex 500  mg IM on 07/28/2011.  2. Hearing impairment.  3. Glaucoma.  4. Gallstones evident on CT scan.  5. Weight loss.    MEDICATIONS:  1. Lumigan 0.01% solution 1 drop into left eye at bedtime.  2. Cosopt 1 drop into left eye twice daily.  3. Multivitamins 1 a day.  4. Faslodex 500 mg IM being given monthly after the q2 week-induction  period during the first 4 weeks, started on 07/28/2011.    5. Zometa 3.5 mg IV every 2 months. Zometa was started on 03/28/2010.     HISTORY:  I saw Sher Shampine today for followup of her metastatic breast cancer.  Ms. Wragg was last seen by [place] on 10/20/2011.  She  is accompanied by her grandson, [name].  The patient denies any changes in her condition.  She takes in 1 can of Ensure a day.  She has been encouraged to take in more cans of Ensure, but there are financial constraints.  We are obtaining a case of Ensure for her as of today. The patient is without any complaints, specifically pain, shortness of breath or difficulty eating.  She is tolerating her treatments well without any problems.  She is here today for IV Zometa 3.5 mg IV and Faslodex 500 mg IM.  PHYSICAL EXAMINATION:  There is little change.  Weight today is 99.7 pounds.  Patient's weight back in February was 106-1/2 pounds.  Height 5 feet 4 inches.  Body surface area 1.43 sq. m.  Blood pressure 137/71. Other vital signs are normal.  The only real change in the physical exam is the ulcer, which today measures about 2.5 cm across by 3 cm lengthwise.  The smaller ulcer that had been seen previously has healed over.  The ulcer may be slightly smaller than it has been in the past. There is no adenopathy.  Heart, lungs:  Normal.  Abdomen:  Benign. Extremities:  No peripheral edema.  No lymphedema of the arms.  The patient does not have a Port-A-Cath or central catheter.  She needs assistance getting up and down from the examining table.  LABORATORY DATA:  Today, white count 6.5, ANC 4.3, hemoglobin 11.3, hematocrit 34.8, platelets 182,000.  Chemistries and tumor markers from today are pending.  Chemistries from 10/20/2011 were normal.  On 10/20/2011, the CA 27.29 was 282, and the CEA was 43.2.  The tumor markers have decreased since Faslodex started on 07/28/2011.  IMAGING STUDIES:  1. CT scan of the chest, abdomen and pelvis with IV contrast on  08/22/2009 showed some improvement in the transpleural spread of  tumor involving the right hemithorax. The right effusion has  decreased in size from the prior exam of 01/31/2007. There is a  decrease in the size of the spiculated right  middle lobe lesion.  There is diffuse bone metastases with some interval healing of the  destructive lesion involving the posterior elements of T10. The  right chest wall lesion had not significantly changed in size.  There was a new lesion adjacent to the chest wall lesion measuring  0.9 cm. There were gallstones noted. No focal liver  abnormalities.  2. Nuclear medicine whole bone scan from 08/22/2009 showed diffuse  osseous metastases involving the axillary appendicular skeleton.  3. CT scan of the chest, abdomen and pelvis with IV contrast on  07/30/2010 showed no change in the right chest wall masses, similar  lung nodules, similar right-sided pleural effusion and pleural  thickening and similar osseous metastases. There was no evidence  for extra osseous metastases within the abdomen and pelvis.  Cholelithiasis was present. These scans were compared with the CT  scans of 08/14/2009.  4. Bone scan from 07/30/2010 showed widespread osseous metastatic  disease.  5. PET scan from 01/13/2011 showed hypermetabolic nodule within the  right chest wall consistent with malignancy. There was adjacent  skin thickening. There was a probable hypermetabolic nodule or  atelectasis at the right lung base. There was a stable right lower  lobe pulmonary nodule. There was widespread metastatic disease in  the skeleton that was unchanged. Several of the sclerotic lesions  were hypermetabolic consistent with active metastatic process.   IMPRESSION AND PLAN:  Ms. Droz seems to be doing well except for her weight loss.  Once again, we have talked about this.  Today the patient will receive Faslodex 500 mg, her 6th injection.  She will receive Zometa 3.5 mg IV.  We will have Ms. Savino return on or about August 14th for another Faslodex injection.  We will plan to see her again around September 12th, at which time we will check CBC, chemistries, tumor markers.  She will be due for another dose of  Faslodex and another dose of IV Zometa. We plan to continue the Zometa through the end of the year to complete 2 years of treatment.  The patient will also be due for imaging studies before the end of the year.    ______________________________ Samul Dada, [name] DSM/MEDQ  D:  11/11/2011  T:  11/11/2011  Job:  [id number]

## 2011-11-11 NOTE — Telephone Encounter (Signed)
Gave pt appt for August 2013 lab and chemo then in September 2013 lab MD and chemo

## 2011-11-12 ENCOUNTER — Telehealth: Payer: Self-pay | Admitting: Nutrition

## 2011-11-12 ENCOUNTER — Other Ambulatory Visit: Payer: Self-pay | Admitting: Oncology

## 2011-11-12 NOTE — Telephone Encounter (Signed)
Patient called and had questions about a generic Ensure.  She was comparing labels of the product she purchased with the product I gave her and she noticed that Ensure Plus had more calories.  She is concerned regarding the cost of oral supplements.  I assured her that she could still consume the product she purchased.  I have asked her to drink a minimum of 2 Ensure Plus daily and I have provided a 2nd case of Ensure Plus for patient today so she doesn't run out.  She was to consume the generic 250 kcal product in addition to the 2 Ensure Plus daily.  Patient is appreciative.

## 2011-12-09 ENCOUNTER — Ambulatory Visit (HOSPITAL_BASED_OUTPATIENT_CLINIC_OR_DEPARTMENT_OTHER): Payer: Medicare Other

## 2011-12-09 ENCOUNTER — Ambulatory Visit (HOSPITAL_BASED_OUTPATIENT_CLINIC_OR_DEPARTMENT_OTHER): Payer: Medicare Other | Admitting: Nutrition

## 2011-12-09 ENCOUNTER — Other Ambulatory Visit: Payer: Medicare Other | Admitting: Lab

## 2011-12-09 VITALS — BP 155/83 | HR 74 | Temp 97.2°F

## 2011-12-09 DIAGNOSIS — C50919 Malignant neoplasm of unspecified site of unspecified female breast: Secondary | ICD-10-CM

## 2011-12-09 DIAGNOSIS — C7951 Secondary malignant neoplasm of bone: Secondary | ICD-10-CM

## 2011-12-09 DIAGNOSIS — C7952 Secondary malignant neoplasm of bone marrow: Secondary | ICD-10-CM

## 2011-12-09 DIAGNOSIS — Z5111 Encounter for antineoplastic chemotherapy: Secondary | ICD-10-CM

## 2011-12-09 LAB — COMPREHENSIVE METABOLIC PANEL
AST: 24 U/L (ref 0–37)
Alkaline Phosphatase: 78 U/L (ref 39–117)
BUN: 13 mg/dL (ref 6–23)
Creatinine, Ser: 0.75 mg/dL (ref 0.50–1.10)
Glucose, Bld: 95 mg/dL (ref 70–99)

## 2011-12-09 MED ORDER — FULVESTRANT 250 MG/5ML IM SOLN
500.0000 mg | Freq: Once | INTRAMUSCULAR | Status: AC
  Administered 2011-12-09: 500 mg via INTRAMUSCULAR
  Filled 2011-12-09: qty 10

## 2011-12-09 NOTE — Progress Notes (Signed)
[name] requested a brief followup today.  She reports that she is eating well and has a good appetite.  I weighed her today and her weight is stable at 99.8 pounds.  She lives with her daughter who is supportive.  She is drinking Ensure Plus 1 to 2 a day.  She denies other nutrition issues.  The patient remains underweight; however, her weight is stable.  There is no nutrition diagnosis at this time.  I have provided the patient with her 3rd and final case of Ensure Plus at her request.  RD continues to be available for nutrition needs.   ______________________________ Elizabeth [name], RD, CSO, LDN Clinical Nutrition Specialist BN/MEDQ  D:  12/09/2011  T:  12/09/2011  Job:  [id number]

## 2011-12-09 NOTE — Patient Instructions (Signed)
[facility] Discharge Instructions for Patients Receiving Chemotherapy  Today you received the following chemotherapy agents faslodex   To help prevent nausea and vomiting after your treatment, we encourage you to take your nausea medication.   Take it as often as prescribed.     If you develop nausea and vomiting that is not controlled by your nausea medication, call the clinic. If it is after clinic hours your family physician or the after hours number for the clinic or go to the Emergency Department.   BELOW ARE SYMPTOMS THAT SHOULD BE REPORTED IMMEDIATELY:  *FEVER GREATER THAN 100.5 F  *CHILLS WITH OR WITHOUT FEVER  NAUSEA AND VOMITING THAT IS NOT CONTROLLED WITH YOUR NAUSEA MEDICATION  *UNUSUAL SHORTNESS OF BREATH  *UNUSUAL BRUISING OR BLEEDING  TENDERNESS IN MOUTH AND THROAT WITH OR WITHOUT PRESENCE OF ULCERS  *URINARY PROBLEMS  *BOWEL PROBLEMS  UNUSUAL RASH Items with * indicate a potential emergency and should be followed up as soon as possible.  Feel free to call the clinic you have any questions or concerns. The clinic phone number is [phone].   I have been informed and understand all the instructions given to me. I know to contact the clinic, my physician, or go to the Emergency Department if any problems should occur. I do not have any questions at this time, but understand that I may call the clinic during office hours   should I have any questions or need assistance in obtaining follow up care.    __________________________________________  _____________  __________ Signature of Patient or Authorized Representative            Date                   Time    __________________________________________ Nurse's Signature

## 2011-12-16 ENCOUNTER — Telehealth: Payer: Self-pay | Admitting: Medical Oncology

## 2011-12-16 NOTE — Telephone Encounter (Signed)
Pt had called yesterday and needed to combine her lab,MD and treatment for the same day. Her daughter has to get off work to bring her and she can not come 2 different days. I called back with her new appointments. 01/07/12 1130 am labs, 1200 pm Dr. Arline Asp, and 200 pm for infusion. She wrote these down and read these back to me.

## 2012-01-07 ENCOUNTER — Telehealth: Payer: Self-pay | Admitting: *Deleted

## 2012-01-07 ENCOUNTER — Ambulatory Visit: Payer: Self-pay | Admitting: Family

## 2012-01-07 ENCOUNTER — Ambulatory Visit (HOSPITAL_BASED_OUTPATIENT_CLINIC_OR_DEPARTMENT_OTHER): Payer: Medicare Other

## 2012-01-07 ENCOUNTER — Ambulatory Visit: Payer: Self-pay | Admitting: Oncology

## 2012-01-07 ENCOUNTER — Telehealth: Payer: Self-pay | Admitting: Oncology

## 2012-01-07 ENCOUNTER — Ambulatory Visit (HOSPITAL_BASED_OUTPATIENT_CLINIC_OR_DEPARTMENT_OTHER): Payer: Medicare Other | Admitting: Oncology

## 2012-01-07 ENCOUNTER — Encounter: Payer: Self-pay | Admitting: Oncology

## 2012-01-07 ENCOUNTER — Other Ambulatory Visit (HOSPITAL_BASED_OUTPATIENT_CLINIC_OR_DEPARTMENT_OTHER): Payer: Medicare Other | Admitting: Lab

## 2012-01-07 ENCOUNTER — Telehealth: Payer: Self-pay | Admitting: Nutrition

## 2012-01-07 VITALS — BP 143/79 | HR 73 | Temp 97.8°F | Resp 16 | Ht 64.0 in | Wt 102.5 lb

## 2012-01-07 DIAGNOSIS — C7952 Secondary malignant neoplasm of bone marrow: Secondary | ICD-10-CM

## 2012-01-07 DIAGNOSIS — C50919 Malignant neoplasm of unspecified site of unspecified female breast: Secondary | ICD-10-CM

## 2012-01-07 DIAGNOSIS — C7951 Secondary malignant neoplasm of bone: Secondary | ICD-10-CM

## 2012-01-07 DIAGNOSIS — Z5111 Encounter for antineoplastic chemotherapy: Secondary | ICD-10-CM

## 2012-01-07 DIAGNOSIS — C779 Secondary and unspecified malignant neoplasm of lymph node, unspecified: Secondary | ICD-10-CM

## 2012-01-07 DIAGNOSIS — L98499 Non-pressure chronic ulcer of skin of other sites with unspecified severity: Secondary | ICD-10-CM

## 2012-01-07 LAB — CBC WITH DIFFERENTIAL/PLATELET
Basophils Absolute: 0 10*3/uL (ref 0.0–0.1)
EOS%: 2.1 % (ref 0.0–7.0)
Eosinophils Absolute: 0.1 10*3/uL (ref 0.0–0.5)
HCT: 33.9 % — ABNORMAL LOW (ref 34.8–46.6)
HGB: 10.7 g/dL — ABNORMAL LOW (ref 11.6–15.9)
MCH: 27.4 pg (ref 25.1–34.0)
MCV: 86.7 fL (ref 79.5–101.0)
MONO%: 16.4 % — ABNORMAL HIGH (ref 0.0–14.0)
NEUT#: 2.6 10*3/uL (ref 1.5–6.5)
NEUT%: 50.4 % (ref 38.4–76.8)
lymph#: 1.6 10*3/uL (ref 0.9–3.3)

## 2012-01-07 LAB — COMPREHENSIVE METABOLIC PANEL (CC13)
ALT: 10 U/L (ref 0–55)
CO2: 26 mEq/L (ref 22–29)
Calcium: 8.9 mg/dL (ref 8.4–10.4)
Chloride: 106 mEq/L (ref 98–107)
Creatinine: 0.8 mg/dL (ref 0.6–1.1)
Glucose: 109 mg/dl — ABNORMAL HIGH (ref 70–99)
Sodium: 141 mEq/L (ref 136–145)
Total Bilirubin: 0.7 mg/dL (ref 0.20–1.20)
Total Protein: 7.1 g/dL (ref 6.4–8.3)

## 2012-01-07 LAB — LACTATE DEHYDROGENASE (CC13): LDH: 201 U/L (ref 125–220)

## 2012-01-07 LAB — CANCER ANTIGEN 27.29: CA 27.29: 286 U/mL — ABNORMAL HIGH (ref 0–39)

## 2012-01-07 MED ORDER — ZOLEDRONIC ACID 4 MG/5ML IV CONC
3.5000 mg | Freq: Once | INTRAVENOUS | Status: AC
  Administered 2012-01-07: 3.5 mg via INTRAVENOUS
  Filled 2012-01-07: qty 4.38

## 2012-01-07 MED ORDER — SODIUM CHLORIDE 0.9 % IV SOLN: Freq: Once | INTRAVENOUS | Status: DC

## 2012-01-07 MED ORDER — FULVESTRANT 250 MG/5ML IM SOLN
500.0000 mg | Freq: Once | INTRAMUSCULAR | Status: AC
  Administered 2012-01-07: 500 mg via INTRAMUSCULAR
  Filled 2012-01-07: qty 10

## 2012-01-07 NOTE — Telephone Encounter (Signed)
Per staff message and POf I  Have scheduled appt. JMW

## 2012-01-07 NOTE — Progress Notes (Signed)
CC:   Elizabeth [name], [name], [name] [name], [name]  PROBLEM LIST:  1. Metastatic breast cancer originating in the right breast, neglected  primary with diagnosis going back to October 2008. At presentation  the patient had a right trapped lung with pleural disease,  extensive bone metastases and probable liver metastases. Biopsy  showed invasive ductal cancer. Estrogen receptor 91%, progesterone  receptor 1%, HER-2/neu was 1+. At that time the patient was having  fairly severe pain in her back. Also had impending T10 cord  compression. She received radiation treatments to the thoracic  spine from T7-T12 from 02/02/2007 through 02/21/2007. She was  started on Femara 2.5 mg a day on 02/09/2007 and Zometa on  03/28/2010 after receiving dental clearance. The patient was noted  to have progression on her right chest wall in March 2011 and  received radiation treatments to the right chest wall 2000 cGy in  10 fractions from 10/10/2009 through 10/23/2009.  She does have elevated tumor markers,  specifically CEA and CA 27.29. On 02/09/2007, CA27.29 was 2883 and the CEA was 3331.  The patient received Femara 2.5 mg daily from 02/09/2007 through  07/28/2011 initially with a dramatic response but in recent months  has had slow progression. The patient was started on Faslodex 500  mg IM on 07/28/2011.  2. Hearing impairment.  3. Glaucoma.  4. Gallstones evident on CT scan.  5. Weight loss.    MEDICATIONS:  1. Lumigan 0.01% solution 1 drop into left eye at bedtime.  2. Cosopt 1 drop into left eye twice daily.  3. Multivitamins 1 a day.  4. Faslodex 500 mg IM being given monthly after the q2 week-induction  period during the first 4 weeks, started on 07/28/2011.  5. Zometa 3.5 mg IV every 2 months. Zometa was started on 03/28/2010.    SMOKING HISTORY:  The patient has never smoked cigarettes.    HISTORY:  Elizabeth [name] was seen today for followup of her metastatic breast cancer.   The patient was last seen by [place] on 11/11/2011.  She is accompanied by her grandson.  She denies any changes in her condition. She has been able to gain some weight.  She continues to pick at the right chest wall ulcer.  She received Zometa and Faslodex at that time of her last visit here on 11/11/2011.  She had Faslodex 500 mg IM on 12/09/2011 and is here today for her monthly Faslodex, also her bi- monthly Zometa.  She tolerates these treatments well.  As stated, she denies any changes in her condition.  PHYSICAL EXAMINATION:  She looks about the same.  She is 78 years old and will be 27 on November 21st.  Weight is 102.5 pounds, height 5 feet 4 inches, body surface area 1.45 sq. m.  Blood pressure 143/79.  Other vital signs are normal.  There is no major change in her condition except the ulcer, which today is larger than it was 2 months ago.  The ulcer, which has a somewhat raw-looking appearance, measures 3.5 cm across by 3 cm in height.  There are dullness and decreased breath sounds at the right base, which is a stable chronic finding.  It will be recalled that the patient at the time of diagnosis back 5 years ago had a trapped lung on the right side with a lot of pleural disease and pleural effusion.  She also has extensive bone disease and possibly some liver metastases.  There is no adenopathy palpable.  Abdomen:  Benign. Extremities:  No peripheral edema.  No lymphedema of the right arm.  She does not have a Port-A-Cath or central catheter.  Neurologic:  Nonfocal. She needed only a little bit of assistance getting up and down from the examining table.  LABORATORY DATA:  Today, white count 5.2, ANC 2.9, hemoglobin 10.7, hematocrit 33.9, platelets 166,000.  Chemistries today are pending. Chemistries on 11/11/2011 were normal.  On 07/17, the CA27-29 was 287 as compared with 282 on 10/20/2011, 383 on 08/24/2011, 397 on 07/28/2011. CEA on 11/11/2011 was 37.8 as compared with 43.2  on 10/20/2011, 49.4 on 08/24/2011 and 55.1 on 07/28/2011.  IMAGING STUDIES:  1. CT scan of the chest, abdomen and pelvis with IV contrast on  08/22/2009 showed some improvement in the transpleural spread of  tumor involving the right hemithorax. The right effusion has  decreased in size from the prior exam of 01/31/2007. There is a  decrease in the size of the spiculated right middle lobe lesion.  There is diffuse bone metastases with some interval healing of the  destructive lesion involving the posterior elements of T10. The  right chest wall lesion had not significantly changed in size.  There was a new lesion adjacent to the chest wall lesion measuring  0.9 cm. There were gallstones noted. No focal liver  abnormalities.  2. Nuclear medicine whole bone scan from 08/22/2009 showed diffuse  osseous metastases involving the axillary appendicular skeleton.  3. CT scan of the chest, abdomen and pelvis with IV contrast on  07/30/2010 showed no change in the right chest wall masses, similar  lung nodules, similar right-sided pleural effusion and pleural  thickening and similar osseous metastases. There was no evidence  for extra osseous metastases within the abdomen and pelvis.  Cholelithiasis was present. These scans were compared with the CT  scans of 08/14/2009.  4. Bone scan from 07/30/2010 showed widespread osseous metastatic  disease.  5. PET scan from 01/13/2011 showed hypermetabolic nodule within the  right chest wall consistent with malignancy. There was adjacent  skin thickening. There was a probable hypermetabolic nodule or  atelectasis at the right lung base. There was a stable right lower  lobe pulmonary nodule. There was widespread metastatic disease in  the skeleton that was unchanged. Several of the sclerotic lesions  were hypermetabolic consistent with active metastatic process.   IMPRESSION/PLAN:  Elizabeth [name] seems to be holding her own.  She has gained a few pounds.   Her ulcer is a little bigger than it had been.  I am not sure what to make of this.  Her last PET scan was a year ago on 01/13/2011.  We will probably go ahead and repeat imaging studies sometime before the end of the year.  In the meantime, the patient will receive a Faslodex 500 mg IM today and Zometa 3.5 mg IV as well.  She will have another Faslodex shot on October 10th, and we will plan to see Elizabeth [name] again around November 7th, at which time she will be due for Faslodex as well as Zometa.  It was our plan to discontinue IV Zometa at the end of the year to complete 2 years of treatment.   ______________________________ Samul Dada, [name] DSM/MEDQ  D:  01/07/2012  T:  01/07/2012  Job:  [id number]

## 2012-01-07 NOTE — Telephone Encounter (Signed)
Patient requesting additional oral nutrition supplements.  She has received 3 cases of Ensure Plus already.  I will pull a variety of supplements for her to try and she will pick them up today.

## 2012-01-07 NOTE — Progress Notes (Signed)
This office note has been dictated.  #[id number]

## 2012-01-07 NOTE — Patient Instructions (Signed)
[facility] Discharge Instructions for Patients Receiving Chemotherapy  Today you received the following chemotherapy agents Zometa/Faslodex To help prevent nausea and vomiting after your treatment, we encourage you to take your nausea medication as prescribed. If you develop nausea and vomiting that is not controlled by your nausea medication, call the clinic. If it is after clinic hours your family physician or the after hours number for the clinic or go to the Emergency Department.   BELOW ARE SYMPTOMS THAT SHOULD BE REPORTED IMMEDIATELY:  *FEVER GREATER THAN 100.5 F  *CHILLS WITH OR WITHOUT FEVER  NAUSEA AND VOMITING THAT IS NOT CONTROLLED WITH YOUR NAUSEA MEDICATION  *UNUSUAL SHORTNESS OF BREATH  *UNUSUAL BRUISING OR BLEEDING  TENDERNESS IN MOUTH AND THROAT WITH OR WITHOUT PRESENCE OF ULCERS  *URINARY PROBLEMS  *BOWEL PROBLEMS  UNUSUAL RASH Items with * indicate a potential emergency and should be followed up as soon as possible.  One of the nurses will contact you 24 hours after your treatment. Please let the nurse know about any problems that you may have experienced. Feel free to call the clinic you have any questions or concerns. The clinic phone number is [phone].   I have been informed and understand all the instructions given to me. I know to contact the clinic, my physician, or go to the Emergency Department if any problems should occur. I do not have any questions at this time, but understand that I may call the clinic during office hours   should I have any questions or need assistance in obtaining follow up care.    __________________________________________  _____________  __________ Signature of Patient or Authorized Representative            Date                   Time    __________________________________________ Nurse's Signature    

## 2012-01-07 NOTE — Telephone Encounter (Signed)
gve the pt's son the oct and nov 2013 appt calendar

## 2012-01-08 ENCOUNTER — Ambulatory Visit: Payer: Self-pay

## 2012-02-03 ENCOUNTER — Telehealth: Payer: Self-pay | Admitting: Oncology

## 2012-02-03 NOTE — Telephone Encounter (Signed)
called to move inj/lab from 10/10 to 10/14 due to his job   aom

## 2012-02-04 ENCOUNTER — Encounter (HOSPITAL_COMMUNITY): Payer: Self-pay | Admitting: *Deleted

## 2012-02-04 ENCOUNTER — Emergency Department (HOSPITAL_COMMUNITY)
Admission: EM | Admit: 2012-02-04 | Discharge: 2012-02-04 | Disposition: A | Discharge status: Home / Self Care | Payer: Medicare Other | Attending: Emergency Medicine | Admitting: Emergency Medicine

## 2012-02-04 ENCOUNTER — Emergency Department (HOSPITAL_COMMUNITY): Payer: Medicare Other

## 2012-02-04 ENCOUNTER — Ambulatory Visit: Payer: Self-pay

## 2012-02-04 ENCOUNTER — Other Ambulatory Visit: Payer: Self-pay | Admitting: Lab

## 2012-02-04 DIAGNOSIS — S0101XA Laceration without foreign body of scalp, initial encounter: Secondary | ICD-10-CM

## 2012-02-04 DIAGNOSIS — S098XXA Other specified injuries of head, initial encounter: Secondary | ICD-10-CM

## 2012-02-04 DIAGNOSIS — H9190 Unspecified hearing loss, unspecified ear: Secondary | ICD-10-CM | POA: Insufficient documentation

## 2012-02-04 DIAGNOSIS — K8020 Calculus of gallbladder without cholecystitis without obstruction: Secondary | ICD-10-CM | POA: Insufficient documentation

## 2012-02-04 DIAGNOSIS — S0990XA Unspecified injury of head, initial encounter: Secondary | ICD-10-CM | POA: Insufficient documentation

## 2012-02-04 DIAGNOSIS — Z853 Personal history of malignant neoplasm of breast: Secondary | ICD-10-CM | POA: Insufficient documentation

## 2012-02-04 DIAGNOSIS — S0100XA Unspecified open wound of scalp, initial encounter: Secondary | ICD-10-CM | POA: Insufficient documentation

## 2012-02-04 DIAGNOSIS — Z79899 Other long term (current) drug therapy: Secondary | ICD-10-CM | POA: Insufficient documentation

## 2012-02-04 DIAGNOSIS — H409 Unspecified glaucoma: Secondary | ICD-10-CM | POA: Insufficient documentation

## 2012-02-04 DIAGNOSIS — W108XXA Fall (on) (from) other stairs and steps, initial encounter: Secondary | ICD-10-CM | POA: Insufficient documentation

## 2012-02-04 LAB — CBC
HCT: 34 % — ABNORMAL LOW (ref 36.0–46.0)
Hemoglobin: 11 g/dL — ABNORMAL LOW (ref 12.0–15.0)
MCH: 27.8 pg (ref 26.0–34.0)
MCHC: 32.4 g/dL (ref 30.0–36.0)
MCV: 85.9 fL (ref 78.0–100.0)
RDW: 15.9 % — ABNORMAL HIGH (ref 11.5–15.5)

## 2012-02-04 LAB — BASIC METABOLIC PANEL
BUN: 20 mg/dL (ref 6–23)
Calcium: 9.6 mg/dL (ref 8.4–10.5)
Creatinine, Ser: 0.73 mg/dL (ref 0.50–1.10)
GFR calc non Af Amer: 76 mL/min — ABNORMAL LOW (ref >90)
Glucose, Bld: 121 mg/dL — ABNORMAL HIGH (ref 70–99)

## 2012-02-04 NOTE — ED Notes (Signed)
Assisted EDP with applying staples to head laceration.  Bacitracin ointment applied to posterior head laceration.

## 2012-02-04 NOTE — Discharge Instructions (Signed)
Concussion and Brain Injury  A blow or jolt to the head can disrupt the normal function of the brain. This type of brain injury is often called a "concussion" or a "closed head injury." Concussions are usually not life-threatening. Even so, the effects of a concussion can be serious.   CAUSES   A concussion is caused by a blunt blow to the head. The blow might be direct or indirect as described below.  · Direct blow (running into another player during a soccer game, being hit in a fight, or hitting your head on a hard surface).  · Indirect blow (when your head moves rapidly and violently back and forth like in a car crash).  SYMPTOMS   The brain is very complex. Every head injury is different. Some symptoms may appear right away. Other symptoms may not show up for days or weeks after the concussion. The signs of concussion can be hard to notice. Early on, problems may be missed by patients, family members, and caregivers. You may look fine even though you are acting or feeling differently.   These symptoms are usually temporary, but may last for days, weeks, or even longer. Symptoms include:  · Mild headaches that will not go away.  · Having more trouble than usual with:  · Remembering things.  · Paying attention or concentrating.  · Organizing daily tasks.  · Making decisions and solving problems.  · Slowness in thinking, acting, speaking, or reading.  · Getting lost or easily confused.  · Feeling tired all the time or lacking energy (fatigue).  · Feeling drowsy.  · Sleep disturbances.  · Sleeping more than usual.  · Sleeping less than usual.  · Trouble falling asleep.  · Trouble sleeping (insomnia).  · Loss of balance or feeling lightheaded or dizzy.  · Nausea or vomiting.  · Numbness or tingling.  · Increased sensitivity to:  · Sounds.  · Lights.  · Distractions.  Other symptoms might include:  · Vision problems or eyes that tire easily.  · Diminished sense of taste or smell.  · Ringing in the ears.  · Mood  changes such as feeling sad, anxious, or listless.  · Becoming easily irritated or angry for little or no reason.  · Lack of motivation.  DIAGNOSIS   Your caregiver can usually diagnose a concussion or mild brain injury based on your description of your injury and your symptoms.   Your evaluation might include:  · A brain scan to look for signs of injury to the brain. Even if the test shows no injury, you may still have a concussion.  · Blood tests to be sure other problems are not present.  TREATMENT   · People with a concussion need to be examined and evaluated. Most people with concussions are treated in an emergency department, urgent care, or clinic. Some people must stay in the hospital overnight for further treatment.  · Your caregiver will send you home with important instructions to follow. Be sure to carefully follow them.  · Tell your caregiver if you are already taking any medicines (prescription, over-the-counter, or natural remedies), or if you are drinking alcohol or taking illegal drugs. Also, talk with your caregiver if you are taking blood thinners (anticoagulants) or aspirin. These drugs may increase your chances of complications. All of this is important information that may affect treatment.  · Only take over-the-counter or prescription medicines for pain, discomfort, or fever as directed by your caregiver.  PROGNOSIS     How fast people recover from brain injury varies from person to person. Although most people have a good recovery, how quickly they improve depends on many factors. These factors include how severe their concussion was, what part of the brain was injured, their age, and how healthy they were before the concussion.   Because all head injuries are different, so is recovery. Most people with mild injuries recover fully. Recovery can take time. In general, recovery is slower in older persons. Also, persons who have had a concussion in the past or have other medical problems may find  that it takes longer to recover from their current injury. Anxiety and depression may also make it harder to adjust to the symptoms of brain injury.  HOME CARE INSTRUCTIONS   Return to your normal activities slowly, not all at once. You must give your body and brain enough time for recovery.  · Get plenty of sleep at night, and rest during the day. Rest helps the brain to heal.  · Avoid staying up late at night.  · Keep the same bedtime hours on weekends and weekdays.  · Take daytime naps or rest breaks when you feel tired.  · Limit activities that require a lot of thought or concentration (brain or cognitive rest). This includes:  · Homework or job-related work.  · Watching TV.  · Computer work.  · Avoid activities that could lead to a second brain injury, such as contact or recreational sports, until your caregiver says it is okay. Even after your brain injury has healed, you should protect yourself from having another concussion.  · Ask your caregiver when you can return to your normal activities such as driving, bicycling, or operating heavy equipment. Your ability to react may be slower after a brain injury.  · Talk with your caregiver about when you can return to work or school.  · Inform your teachers, school nurse, school counselor, coach, athletic trainer, or work manager about your injury, symptoms, and restrictions. They should be instructed to report:  · Increased problems with attention or concentration.  · Increased problems remembering or learning new information.  · Increased time needed to complete tasks or assignments.  · Increased irritability or decreased ability to cope with stress.  · Increased symptoms.  · Take only those medicines that your caregiver has approved.  · Do not drink alcohol until your caregiver says you are well enough to do so. Alcohol and certain other drugs may slow your recovery and can put you at risk of further injury.  · If it is harder than usual to remember things,  write them down.  · If you are easily distracted, try to do one thing at a time. For example, do not try to watch TV while fixing dinner.  · Talk with family members or close friends when making important decisions.  · Keep all follow-up appointments. Repeated evaluation of your symptoms is recommended for your recovery.  PREVENTION   Protect your head from future injury. It is very important to avoid another head or brain injury before you have recovered. In rare cases, another injury has lead to permanent brain damage, brain swelling, or death. Avoid injuries by using:  · Seatbelts when riding in a car.  · Alcohol only in moderation.  · A helmet when biking, skiing, skateboarding, skating, or doing similar activities.  · Safety measures in your home.  · Remove clutter and tripping hazards from floors and stairways.  · Use grab   Depression or mood swings.  Anxiety or irritability.  Memory problems.  Difficulty concentrating or paying attention.  Sleep problems.  Feeling tired all the time. SEEK IMMEDIATE MEDICAL CARE IF:  You have had a blow or jolt to the head and you (or your family or friends) notice:  Severe or worsening headaches.  Weakness (even if only in one hand or one leg or one part of the face), numbness, or decreased coordination.  Repeated vomiting.  Increased sleepiness or passing out.  One black center of the eye (pupil) is larger than the other.  Convulsions (seizures).  Slurred speech.  Increasing  confusion, restlessness, agitation, or irritability.  Lack of ability to recognize people or places.  Neck pain.  Difficulty being awakened.  Unusual behavior changes.  Loss of consciousness. Older adults with a brain injury may have a higher risk of serious complications such as a blood clot on the brain. Headaches that get worse or an increase in confusion are signs of this complication. If these signs occur, see a caregiver right away. MAKE SURE YOU:   Understand these instructions.  Will watch your condition.  Will get help right away if you are not doing well or get worse. FOR MORE INFORMATION  Several groups help people with brain injury and their families. They provide information and put people in touch with local resources. These include support groups, rehabilitation services, and a variety of health care professionals. Among these groups, the Brain Injury Association (BIA, www.biausa.org) has a Secretary/administrator that gathers scientific and educational information and works on a national level to help people with brain injury.  Document Released: 07/04/2003 Document Revised: 07/06/2011 Document Reviewed: 11/30/2007 [facility] Patient Information 2013 [place], [place]. Concussion and Brain Injury A blow to the head can stop the brain from working normally (concussion). It is usually not life-threatening. However, the results of the injury can be serious. Problems caused by the injury might show up right away or days or weeks later. Getting better might take some time. HOME CARE  Rest your body. Ways to rest your body include:  Getting plenty of sleep at night.  Going to sleep early.  Taking naps during the day when you feel tired.  Limit activities that require a lot of thought. This includes:  Time spent with homework.  Time spent with work related to a job.  TV watching.  Computer use.  Return to normal activities (driving, work, school) only when your doctor says  it is okay.  Avoid high impact activity and sports until your doctor says it is okay.  Take medicines only as told by your doctor.  Do not drink alcohol until your doctor says it is okay.  Do not make important decisions without help until you feel better.  Follow up with your doctor as told. GET HELP RIGHT AWAY IF:  You, your family, or your friends notice that:  You have bad headaches, or they get worse.  You have weakness, loss of feeling (numbness), or you feel off balance.  You keep throwing up (vomiting).  You feel tired or pass out (faint).  One black center of your eye (pupil) is larger than the other.  You twitch or shake (seize).  Your speech is not clear (slurred).  You are confused, restless, easily angered (agitated), or annoyed (irritable).  You cannot recognize or respond to people or activities.  You have neck pain.  You have trouble being woken up.  Your behavior changes. MAKE SURE YOU:  Understand these instructions.  Will watch your condition.  Will get help right away if you are not doing well or get worse. Document Released: 04/01/2009 Document Revised: 07/06/2011 Document Reviewed: 04/01/2009 [facility] Patient Information 2013 [place], [place].

## 2012-02-04 NOTE — ED Provider Notes (Signed)
History     CSN: [id number]  Arrival date & time 02/04/12  1928  First MD Initiated Contact with Patient 02/04/12 2045      Chief Complaint  Patient presents with  . Fall   HPI   78 year old female presents after a mechanical fall. Patient states she was walking up stairs in the dark, lost her footing on one of the steps, and fell down. Denies any loss of consciousness, nausea, weakness, tingling. She denies any chest pain, shortness of breath, palpitations prior to the fall. She currently reports headache and has a laceration to her posterior scalp.  Past Medical History  Diagnosis Date  . Breast cancer     (Rt) breast ca dx 2008  . Glaucoma(365)   . Breast lump   . Weight decrease   . Cord compression     T10  . HOH (hard of hearing)   . Gallstones     History reviewed. No pertinent past surgical history.  Family History  Problem Relation Age of Onset  . Cancer Daughter     breast cancer    History  Substance Use Topics  . Smoking status: Never Smoker   . Smokeless tobacco: Not on file  . Alcohol Use: No    OB History    Grav Para Term Preterm Abortions TAB SAB Ect Mult Living                  Review of Systems  Constitutional: Negative for fever, chills, activity change and appetite change.  HENT: Negative for ear pain, congestion, rhinorrhea and neck pain.   Eyes: Negative for pain.  Respiratory: Negative for cough and shortness of breath.   Cardiovascular: Negative for chest pain and palpitations.  Gastrointestinal: Negative for nausea, vomiting and abdominal pain.  Genitourinary: Negative for dysuria, difficulty urinating and pelvic pain.  Musculoskeletal: Negative for back pain.       Posterior head pain  Skin: Positive for wound ( Scalp LAC). Negative for rash.  Neurological: Negative for weakness and headaches.  Psychiatric/Behavioral: Negative for behavioral problems, confusion and agitation.    Allergies  Review of patient's allergies  indicates no known allergies.  Home Medications   Current Outpatient Rx  Name Route Sig Dispense Refill  . BIMATOPROST 0.01 % OP SOLN Left Eye Place 1 drop into the left eye at bedtime.     . DORZOLAMIDE HCL-TIMOLOL MAL 22.3-6.8 MG/ML OP SOLN Left Eye Place 1 drop into the left eye 2 (two) times daily.     . FASLODEX IM Intramuscular Inject into the muscle every morning.    Marland Kitchen ONE-DAILY MULTI VITAMINS PO TABS Oral Take 1 tablet by mouth daily.    Marland Kitchen ZOMETA IV Intravenous Inject into the vein every 3 (three) months.      BP 131/64  Pulse 90  Temp 97.9 F (36.6 C) (Oral)  Resp 16  SpO2 96%  Physical Exam  Constitutional: She is oriented to person, place, and time. She appears well-developed and well-nourished. No distress.  HENT:  Head: Normocephalic and atraumatic.  Nose: Nose normal.  Mouth/Throat: Oropharynx is clear and moist.  Eyes: EOM are normal. Pupils are equal, round, and reactive to light.  Neck: Normal range of motion. Neck supple. No tracheal deviation present.  Cardiovascular: Normal rate, regular rhythm, normal heart sounds and intact distal pulses.   Pulmonary/Chest: Effort normal and breath sounds normal. She has no rales.  Abdominal: Soft. Bowel sounds are normal. She exhibits no distension. There is  no tenderness. There is no rebound and no guarding.  Musculoskeletal: Normal range of motion. She exhibits no tenderness.  Neurological: She is alert and oriented to person, place, and time. She has normal strength. No cranial nerve deficit or sensory deficit. She exhibits normal muscle tone. She displays a negative Romberg sign. GCS eye subscore is 4. GCS verbal subscore is 5. GCS motor subscore is 6.  Skin: Skin is warm and dry. No rash noted.       3.5 cm laceration to the occiput.  Psychiatric: She has a normal mood and affect. Her behavior is normal.    ED Course  LACERATION REPAIR Date/Time: 02/05/2012 10:30 PM Performed by: Nadara Mustard Authorized by:  Nadara Mustard Consent: Verbal consent obtained. Consent given by: patient Patient identity confirmed: verbally with patient and arm band Body area: head/neck Location details: scalp Laceration length: 4 cm Foreign bodies: no foreign bodies Tendon involvement: none Nerve involvement: none Vascular damage: no Anesthesia: local infiltration Local anesthetic: lidocaine 2% with epinephrine Anesthetic total: 4 ml Patient sedated: no Irrigation solution: saline Irrigation method: syringe Amount of cleaning: standard Debridement: none Degree of undermining: none Skin closure: staples Number of sutures: 4 Technique: simple Approximation: close Approximation difficulty: simple Dressing: antibiotic ointment Patient tolerance: Patient tolerated the procedure well with no immediate complications.   (including critical care time)  Results for orders placed during the hospital encounter of 02/04/12  CBC      Component Value Range   WBC 7.4  4.0 - 10.5 K/uL   RBC 3.96  3.87 - 5.11 MIL/uL   Hemoglobin 11.0 (*) 12.0 - 15.0 g/dL   HCT 52.8 (*) 41.3 - 24.4 %   MCV 85.9  78.0 - 100.0 fL   MCH 27.8  26.0 - 34.0 pg   MCHC 32.4  30.0 - 36.0 g/dL   RDW 01.0 (*) 27.2 - 53.6 %   Platelets 180  150 - 400 K/uL  BASIC METABOLIC PANEL      Component Value Range   Sodium 140  135 - 145 mEq/L   Potassium 4.0  3.5 - 5.1 mEq/L   Chloride 103  96 - 112 mEq/L   CO2 25  19 - 32 mEq/L   Glucose, Bld 121 (*) 70 - 99 mg/dL   BUN 20  6 - 23 mg/dL   Creatinine, Ser 6.44  0.50 - 1.10 mg/dL   Calcium 9.6  8.4 - 03.4 mg/dL   GFR calc non Af Amer 76 (*) >90 mL/min   GFR calc Af Amer 88 (*) >90 mL/min       CT Head Wo Contrast (Final result)   Result time:02/04/12 2202    Final result by Rad Results In Interface (02/04/12 22:02:53)    Narrative:   *RADIOLOGY REPORT*  Clinical Data: Fall, posterior scalp laceration  CT HEAD WITHOUT CONTRAST  Technique: Contiguous axial images were obtained from  the base of the skull through the vertex without contrast.  Comparison: None.  Findings: No acute hemorrhage, acute infarction, or mass lesion is identified. No midline shift. No ventriculomegaly. No skull fracture. Mild diffuse cortical volume loss noted with proportional ventricular prominence and periventricular white matter hypodensity likely indicating small vessel ischemic change. Left greater than right maxillary sinus wall thickening may indicate chronic sinusitis. Complete visualized opacification of the right maxillary sinus is noted. Ethmoid sinus mucoperiosteal thickening noted. High left posterior scalp laceration noted, image 60.  IMPRESSION: No acute intracranial finding.  Chronic sinusitis.   Original Report  Authenticated By: Harrel Lemon, [name]        Date: 02/04/2012  Rate: 83  Rhythm: normal sinus rhythm  QRS Axis: normal  Intervals: normal  ST/T Wave abnormalities: nonspecific ST changes  Conduction Disutrbances:none  Narrative Interpretation:   Old EKG Reviewed: unchanged and changes noted   1. Blunt head injury   2. Scalp laceration       MDM   78 year old female in no acute distress, afebrile, vital signs stable, nontoxic appearing presents to to a mechanical fall. Laceration to the occipital scalp. Wound repaired as described above. Patient tolerated procedure well. CT scan negative for acute intracranial abnormality. EKG with no ST segment elevation, normal sinus rhythm, no conduction disturbances. Patient denies any chest pain, shortness of breath, palpitations. Or discussion with the patient and grandson on proper wound care, and head injury precautions. Return precautions were also given. Patient and grandson expressed understanding.       Nadara Mustard, MD 02/05/12 0011  I performed a history and physical examination of Elizabeth [name] and discussed his management with Dr. Colbert Coyer.  I agree with the history, physical,  assessment, and plan of care, with the following exceptions: None  I saw the ECG, relevant labs and studies - I agree with the interpretation.  I was present for all key portions of the procedure.  Gerhard Munch, MD 02/05/12 0020

## 2012-02-04 NOTE — ED Notes (Signed)
She fell down steps insdie in the dark.  She has a lac to the scalp with stinging pain.  No loc minimal bleeding now from the laceration.  No other pain.  She is being treated for  Breast cancer no porta cath or hickman

## 2012-02-08 ENCOUNTER — Ambulatory Visit (HOSPITAL_BASED_OUTPATIENT_CLINIC_OR_DEPARTMENT_OTHER): Payer: Medicare Other

## 2012-02-08 ENCOUNTER — Other Ambulatory Visit (HOSPITAL_BASED_OUTPATIENT_CLINIC_OR_DEPARTMENT_OTHER): Payer: Medicare Other | Admitting: Lab

## 2012-02-08 ENCOUNTER — Encounter: Payer: Self-pay | Admitting: Nutrition

## 2012-02-08 VITALS — BP 142/71 | HR 71 | Temp 96.9°F

## 2012-02-08 DIAGNOSIS — C50919 Malignant neoplasm of unspecified site of unspecified female breast: Secondary | ICD-10-CM

## 2012-02-08 DIAGNOSIS — C7952 Secondary malignant neoplasm of bone marrow: Secondary | ICD-10-CM

## 2012-02-08 DIAGNOSIS — C7951 Secondary malignant neoplasm of bone: Secondary | ICD-10-CM

## 2012-02-08 DIAGNOSIS — Z5111 Encounter for antineoplastic chemotherapy: Secondary | ICD-10-CM

## 2012-02-08 LAB — BASIC METABOLIC PANEL (CC13)
Calcium: 9 mg/dL (ref 8.4–10.4)
Potassium: 3.9 mEq/L (ref 3.5–5.1)
Sodium: 140 mEq/L (ref 136–145)

## 2012-02-08 MED ORDER — FULVESTRANT 250 MG/5ML IM SOLN
500.0000 mg | Freq: Once | INTRAMUSCULAR | Status: AC
  Administered 2012-02-08: 500 mg via INTRAMUSCULAR
  Filled 2012-02-08: qty 10

## 2012-02-08 NOTE — Progress Notes (Signed)
Patient called requesting additional Ensure Plus samples. She states she is coming in today for an appointment. She is requesting I leave samples for her at the desk. I will pull a variety of nutrition samples for patient and leave them for her at her request.

## 2012-02-17 ENCOUNTER — Telehealth: Payer: Self-pay | Admitting: Medical Oncology

## 2012-02-17 NOTE — Telephone Encounter (Signed)
[facility] called to verify the dosage of faslodex pt received 07/28/11,4/16/413,09/22/11. I told her pt received 500 mg.

## 2012-03-03 ENCOUNTER — Ambulatory Visit (HOSPITAL_BASED_OUTPATIENT_CLINIC_OR_DEPARTMENT_OTHER): Payer: Medicare Other | Admitting: Oncology

## 2012-03-03 ENCOUNTER — Telehealth: Payer: Self-pay | Admitting: Oncology

## 2012-03-03 ENCOUNTER — Encounter: Payer: Self-pay | Admitting: Nutrition

## 2012-03-03 ENCOUNTER — Other Ambulatory Visit (HOSPITAL_BASED_OUTPATIENT_CLINIC_OR_DEPARTMENT_OTHER): Payer: Medicare Other | Admitting: Lab

## 2012-03-03 ENCOUNTER — Ambulatory Visit (HOSPITAL_BASED_OUTPATIENT_CLINIC_OR_DEPARTMENT_OTHER): Payer: Medicare Other

## 2012-03-03 ENCOUNTER — Encounter: Payer: Self-pay | Admitting: Oncology

## 2012-03-03 VITALS — BP 142/77 | HR 76 | Temp 97.7°F | Resp 18 | Ht 64.0 in | Wt 98.9 lb

## 2012-03-03 DIAGNOSIS — C7951 Secondary malignant neoplasm of bone: Secondary | ICD-10-CM

## 2012-03-03 DIAGNOSIS — C50919 Malignant neoplasm of unspecified site of unspecified female breast: Secondary | ICD-10-CM

## 2012-03-03 DIAGNOSIS — L98499 Non-pressure chronic ulcer of skin of other sites with unspecified severity: Secondary | ICD-10-CM

## 2012-03-03 DIAGNOSIS — J90 Pleural effusion, not elsewhere classified: Secondary | ICD-10-CM

## 2012-03-03 LAB — CBC WITH DIFFERENTIAL/PLATELET
BASO%: 0.7 % (ref 0.0–2.0)
Eosinophils Absolute: 0.1 10*3/uL (ref 0.0–0.5)
HCT: 34.7 % — ABNORMAL LOW (ref 34.8–46.6)
LYMPH%: 31.3 % (ref 14.0–49.7)
MCHC: 31.7 g/dL (ref 31.5–36.0)
MONO#: 0.8 10*3/uL (ref 0.1–0.9)
NEUT#: 3.3 10*3/uL (ref 1.5–6.5)
NEUT%: 54.1 % (ref 38.4–76.8)
Platelets: 141 10*3/uL — ABNORMAL LOW (ref 145–400)
RBC: 4.06 10*6/uL (ref 3.70–5.45)
WBC: 6 10*3/uL (ref 3.9–10.3)
lymph#: 1.9 10*3/uL (ref 0.9–3.3)
nRBC: 0 % (ref 0–0)

## 2012-03-03 LAB — COMPREHENSIVE METABOLIC PANEL (CC13)
ALT: 11 U/L (ref 0–55)
AST: 26 U/L (ref 5–34)
Albumin: 3.8 g/dL (ref 3.5–5.0)
CO2: 27 mEq/L (ref 22–29)
Calcium: 9.3 mg/dL (ref 8.4–10.4)
Chloride: 106 mEq/L (ref 98–107)
Creatinine: 0.8 mg/dL (ref 0.6–1.1)
Potassium: 3.8 mEq/L (ref 3.5–5.1)

## 2012-03-03 LAB — LACTATE DEHYDROGENASE (CC13): LDH: 212 U/L (ref 125–245)

## 2012-03-03 MED ORDER — SODIUM CHLORIDE 0.9 % IV SOLN
INTRAVENOUS | Status: DC
  Administered 2012-03-03: 16:00:00 via INTRAVENOUS

## 2012-03-03 MED ORDER — SODIUM CHLORIDE 0.9 % IJ SOLN
3.0000 mL | Freq: Once | INTRAMUSCULAR | Status: DC | PRN
  Filled 2012-03-03: qty 10

## 2012-03-03 MED ORDER — ZOLEDRONIC ACID 4 MG/5ML IV CONC
3.5000 mg | Freq: Once | INTRAVENOUS | Status: AC
  Administered 2012-03-03: 3.5 mg via INTRAVENOUS
  Filled 2012-03-03: qty 4.38

## 2012-03-03 NOTE — Patient Instructions (Signed)
Faslodex on 03/31/12.  Please try to drink more Ensure plus---3 to 4 cans daily.

## 2012-03-03 NOTE — Progress Notes (Signed)
Patient called requesting additional samples of oral nutrition supplements. I provided her with a variety of oral nutrition supplements to be given to her today during her doctor's appointment.

## 2012-03-03 NOTE — Patient Instructions (Signed)
Zoledronic Acid injection (Hypercalcemia, Oncology) What is this medicine? ZOLEDRONIC ACID (ZOE le dron ik AS id) lowers the amount of calcium loss from bone. It is used to treat too much calcium in your blood from cancer. It is also used to prevent complications of cancer that has spread to the bone. This medicine may be used for other purposes; ask your health care provider or pharmacist if you have questions. What should I tell my health care provider before I take this medicine? They need to know if you have any of these conditions: -aspirin-sensitive asthma -dental disease -kidney disease -an unusual or allergic reaction to zoledronic acid, other medicines, foods, dyes, or preservatives -pregnant or trying to get pregnant -breast-feeding How should I use this medicine? This medicine is for infusion into a vein. It is given by a health care professional in a hospital or clinic setting. Talk to your pediatrician regarding the use of this medicine in children. Special care may be needed. Overdosage: If you think you have taken too much of this medicine contact a poison control center or emergency room at once. NOTE: This medicine is only for you. Do not share this medicine with others. What if I miss a dose? It is important not to miss your dose. Call your doctor or health care professional if you are unable to keep an appointment. What may interact with this medicine? -certain antibiotics given by injection -NSAIDs, medicines for pain and inflammation, like ibuprofen or naproxen -some diuretics like bumetanide, furosemide -teriparatide -thalidomide This list may not describe all possible interactions. Give your health care provider a list of all the medicines, herbs, non-prescription drugs, or dietary supplements you use. Also tell them if you smoke, drink alcohol, or use illegal drugs. Some items may interact with your medicine. What should I watch for while using this medicine? Visit  your doctor or health care professional for regular checkups. It may be some time before you see the benefit from this medicine. Do not stop taking your medicine unless your doctor tells you to. Your doctor may order blood tests or other tests to see how you are doing. Women should inform their doctor if they wish to become pregnant or think they might be pregnant. There is a potential for serious side effects to an unborn child. Talk to your health care professional or pharmacist for more information. You should make sure that you get enough calcium and vitamin D while you are taking this medicine. Discuss the foods you eat and the vitamins you take with your health care professional. Some people who take this medicine have severe bone, joint, and/or muscle pain. This medicine may also increase your risk for a broken thigh bone. Tell your doctor right away if you have pain in your upper leg or groin. Tell your doctor if you have any pain that does not go away or that gets worse. What side effects may I notice from receiving this medicine? Side effects that you should report to your doctor or health care professional as soon as possible: -allergic reactions like skin rash, itching or hives, swelling of the face, lips, or tongue -anxiety, confusion, or depression -breathing problems -changes in vision -feeling faint or lightheaded, falls -jaw burning, cramping, pain -muscle cramps, stiffness, or weakness -trouble passing urine or change in the amount of urine Side effects that usually do not require medical attention (report to your doctor or health care professional if they continue or are bothersome): -bone, joint, or muscle pain -  fever -hair loss -irritation at site where injected -loss of appetite -nausea, vomiting -stomach upset -tired This list may not describe all possible side effects. Call your doctor for medical advice about side effects. You may report side effects to FDA at  1-800-FDA-1088. Where should I keep my medicine? This drug is given in a hospital or clinic and will not be stored at home. NOTE: This sheet is a summary. It may not cover all possible information. If you have questions about this medicine, talk to your doctor, pharmacist, or health care provider.  2012, [employer]. (10/10/2010 9:06:58 AM) 

## 2012-03-03 NOTE — Telephone Encounter (Signed)
appt made and printed for pt aom °

## 2012-03-03 NOTE — Progress Notes (Signed)
This office note has been dictated.  #[id number]

## 2012-03-04 ENCOUNTER — Other Ambulatory Visit: Payer: Self-pay | Admitting: Oncology

## 2012-03-04 ENCOUNTER — Telehealth: Payer: Self-pay

## 2012-03-04 DIAGNOSIS — C7951 Secondary malignant neoplasm of bone: Secondary | ICD-10-CM

## 2012-03-04 NOTE — Telephone Encounter (Signed)
S/w pt about increased tumor markers and need for PET scan. Pt to be expecting a call to schedule test. Pt wrote down information, questions were answered.

## 2012-03-04 NOTE — Progress Notes (Signed)
CC:   [name], [name], [name] [name], [name]  PROBLEM LIST:  1. Metastatic breast cancer originating in the right breast, neglected  primary with diagnosis going back to October 2008. At presentation  the patient had a right trapped lung with pleural disease,  extensive bone metastases and probable liver metastases. Biopsy  showed invasive ductal cancer. Estrogen receptor 91%, progesterone  receptor 1%, HER-2/neu was 1+. At that time the patient was having  fairly severe pain in her back. Also had impending T10 cord  compression. She received radiation treatments to the thoracic  spine from T7-T12 from 02/02/2007 through 02/21/2007. She was  started on Femara 2.5 mg a day on 02/09/2007 and Zometa on  03/28/2010 after receiving dental clearance. The patient was noted  to have progression on her right chest wall in March 2011 and  received radiation treatments to the right chest wall 2000 cGy in  10 fractions from 10/10/2009 through 10/23/2009.  She does have elevated tumor markers, specifically CEA and CA 27.29. On 02/09/2007, CA27.29 was 2883 and the CEA was 3331.  The patient received Femara 2.5 mg daily from 02/09/2007 through  07/28/2011 initially with a dramatic response but in recent months  has had slow progression. The patient was started on Faslodex 500  mg IM on 07/28/2011.   2. Hearing impairment.  3. Glaucoma.  4. Gallstones evident on CT scan.  5. Weight loss.  The patient weighed approximately 120 pounds prior to diagnosis of metastatic breast cancer in October 2008.  Her weight has been progressively decreasing over the past few years.  The patient complains of anorexia.    MEDICATIONS:  1. Lumigan 0.01% solution 1 drop into left eye at bedtime.  2. Cosopt 1 drop into left eye twice daily.  3. Multivitamins 1 a day.  4. Faslodex 500 mg IM being given monthly after the q2 week-induction  period during the first 4 weeks, started on 07/28/2011.  5.  Zometa 3.5 mg IV every 2 months. Zometa was given from 03/28/2010 through 03/03/2012.   SMOKING HISTORY: The patient has never smoked cigarettes.   HISTORY:  I saw Elizabeth [name] today for followup of her metastatic breast cancer with widespread involvement.  Elizabeth [name] was accompanied by her grandson, [name].  She was last seen by [place] on 01/07/2012, at which time she received Faslodex 500 mg IM and Zometa 3.5 mg IV.  The patient apparently fell and suffered a laceration of her scalp on 02/04/2012.  A CT scan of the head without IV contrast showed no acute intracranial findings.  Chronic sinusitis was present.  The patient received another dose of Faslodex 500 mg IM on October 14th.  There have been no significant changes in her condition.  She continues to eat poorly, has perhaps 1 can of Ensure a day.  She complains of anorexia.  Today we did discuss treatment options, specifically megestrol and corticosteroids. I also reviewed the potential side effects associated with those drugs. The patient and her grandson are not interested in a trial of those drugs  The patient is without any new complaints.  She will be turning 87 in a couple weeks.  PHYSICAL EXAMINATION:  General:  There is little change.  As stated, the patient will be 87 in 2 weeks.  Weight today is 98.9 pounds, down from 102.5 pounds on 01/07/2012.  Weight was 99.7 pounds on 11/11/2011. Height 5 feet 4 inches, body surface area 1.42 sq m.  Vital Signs: Blood pressure 142/77.  Other vital signs are normal.  HEENT:  There is no scleral icterus.  Mouth and pharynx are benign.  No peripheral adenopathy palpable.  Lungs:  She continues to have some dullness and decreased breath sounds at the right base.  This has been a stable finding.  Cardiac:  Regular rhythm without murmur or rub.  Chest:  Left breast is pendulous without suspicious findings.  The right chest wall shows a clean shallow ulcer measuring 3 x 2.5 cm.  This may  be slightly smaller than the ulcer was on 01/07/2012.  Rest of the chest wall looks about the same.  No other ulcerations, definite skin pebbling.  Right chest wall has undergone auto mastectomy.  There is some erythema. Abdomen:  Benign.  Extremities:  No peripheral edema.  No obvious lymphedema of the right arm.  No Port-A-Cath or central catheter. Neurologic:  Nonfocal.  The patient is frail.  LABORATORY DATA:  Today white count 6.0, ANC 3.3, hemoglobin 11.0, hematocrit 34.7, platelets 141,000.  Chemistries and tumor markers today are pending.  Chemistries from 01/07/2012 were essentially normal, except for an albumin of 3.4 and a glucose of 109.  CEA was 43.1 as compared with 37.8 on 11/11/2011 and 43.2 on 10/20/2011.  CA27.2 29 was 286 which has been stable over the past few months and is certainly down from 383 on 08/24/2011.  IMAGING STUDIES:  1. CT scan of the chest, abdomen and pelvis with IV contrast on  08/22/2009 showed some improvement in the transpleural spread of  tumor involving the right hemithorax. The right effusion has  decreased in size from the prior exam of 01/31/2007. There is a  decrease in the size of the spiculated right middle lobe lesion.  There is diffuse bone metastases with some interval healing of the  destructive lesion involving the posterior elements of T10. The  right chest wall lesion had not significantly changed in size.  There was a new lesion adjacent to the chest wall lesion measuring  0.9 cm. There were gallstones noted. No focal liver  abnormalities.  2. Nuclear medicine whole bone scan from 08/22/2009 showed diffuse  osseous metastases involving the axillary appendicular skeleton.  3. CT scan of the chest, abdomen and pelvis with IV contrast on  07/30/2010 showed no change in the right chest wall masses, similar  lung nodules, similar right-sided pleural effusion and pleural  thickening and similar osseous metastases. There was no  evidence  for extra osseous metastases within the abdomen and pelvis.  Cholelithiasis was present. These scans were compared with the CT  scans of 08/14/2009.  4. Bone scan from 07/30/2010 showed widespread osseous metastatic  disease.  5. PET scan from 01/13/2011 showed hypermetabolic nodule within the  right chest wall consistent with malignancy. There was adjacent  skin thickening. There was a probable hypermetabolic nodule or  atelectasis at the right lung base. There was a stable right lower  lobe pulmonary nodule. There was widespread metastatic disease in  the skeleton that was unchanged. Several of the sclerotic lesions  were hypermetabolic consistent with active metastatic process. 6. CT scan of head without IV contrast on 02/04/2012 showed no acute intracranial finding.  Chronic sinusitis was present.   IMPRESSION AND PLAN:  Elizabeth [name] continues to do fairly well.  Once again we have tried to urge her to take in more in the way and nutritional supplements.  Her right chest wall ulcer is a little smaller than it was 2 months ago.  The patient  will receive Faslodex 500 mg IM today and Zometa 3.5 mg IV.  We will have completed 2 years of Zometa.  The patient will return on December 5th for another Faslodex shot 500 mg IM.  We will plan to see Elizabeth [name] again around January second, at which time we will check CBC, chemistries, CEA, and CA27.2 29.  The patient will be due for another Faslodex dose.  Addendum: CEA was 54.2 and CA 27.29 was 387, both significantly increased over recent values. We will go ahead and obtain another PET scan, to be compared with the PET scan of 01/13/2011.  ______________________________ Samul Dada, [name] DSM/MEDQ  D:  03/03/2012  T:  03/04/2012  Job:  [id number]

## 2012-03-07 ENCOUNTER — Telehealth: Payer: Self-pay | Admitting: Oncology

## 2012-03-07 NOTE — Telephone Encounter (Signed)
[facility] will contact pt w/pet appt. Pet soon per 11/8 pof. No other orders.

## 2012-03-11 ENCOUNTER — Ambulatory Visit (HOSPITAL_COMMUNITY)
Admission: RE | Admit: 2012-03-11 | Discharge: 2012-03-11 | Disposition: A | Discharge status: Home / Self Care | Payer: Medicare Other | Source: Ambulatory Visit | Attending: Oncology | Admitting: Oncology

## 2012-03-11 ENCOUNTER — Encounter (HOSPITAL_COMMUNITY): Payer: Self-pay

## 2012-03-11 DIAGNOSIS — C50919 Malignant neoplasm of unspecified site of unspecified female breast: Secondary | ICD-10-CM | POA: Insufficient documentation

## 2012-03-11 DIAGNOSIS — C7951 Secondary malignant neoplasm of bone: Secondary | ICD-10-CM | POA: Insufficient documentation

## 2012-03-11 DIAGNOSIS — C787 Secondary malignant neoplasm of liver and intrahepatic bile duct: Secondary | ICD-10-CM | POA: Insufficient documentation

## 2012-03-11 DIAGNOSIS — C7952 Secondary malignant neoplasm of bone marrow: Secondary | ICD-10-CM | POA: Insufficient documentation

## 2012-03-11 MED ORDER — FLUDEOXYGLUCOSE F - 18 (FDG) INJECTION
18.2000 | Freq: Once | INTRAVENOUS | Status: AC | PRN
  Administered 2012-03-11: 18.2 via INTRAVENOUS

## 2012-03-17 ENCOUNTER — Other Ambulatory Visit: Payer: Self-pay | Admitting: Medical Oncology

## 2012-03-17 ENCOUNTER — Telehealth: Payer: Self-pay | Admitting: Medical Oncology

## 2012-03-17 ENCOUNTER — Other Ambulatory Visit: Payer: Self-pay | Admitting: Oncology

## 2012-03-17 ENCOUNTER — Encounter: Payer: Self-pay | Admitting: Oncology

## 2012-03-17 NOTE — Progress Notes (Signed)
To consider Aromasin with or without Afinitor 2.5 mg/day.  Patient has striking progression in liver and a new destructive lesion of right maxilla on PET scan from 03/11/2012 c/w the PET scan from 01/13/2011.

## 2012-03-17 NOTE — Telephone Encounter (Signed)
S/w pt re appt for 11/26.

## 2012-03-17 NOTE — Telephone Encounter (Signed)
Calling to to let her know that Dr. Arline Asp would like for her to come in to discuss her PET results.

## 2012-03-21 ENCOUNTER — Telehealth: Payer: Self-pay | Admitting: Medical Oncology

## 2012-03-21 ENCOUNTER — Telehealth: Payer: Self-pay | Admitting: Oncology

## 2012-03-21 ENCOUNTER — Other Ambulatory Visit: Payer: Self-pay | Admitting: Medical Oncology

## 2012-03-21 NOTE — Telephone Encounter (Signed)
I called pt to let her know that we have rescheduled her appt from tomorrow to 03/28/12 at 900am. I told her we will try and get her zometa done on that day also. She voiced understanding.

## 2012-03-21 NOTE — Telephone Encounter (Signed)
I called pt to let her know that Dr. Arline Asp is scheduling her an appointment to come in to discuss her scan results. She voiced understanding.

## 2012-03-21 NOTE — Telephone Encounter (Signed)
Not able to reach pt by phone or lm (line busy). Moved 04/28/12 appt to 05/02/12 poof. Mailed new schedule. Also pt has another f/u 12/2. Added comment to 12/2 appt for pt to get new schedule when she comes in.

## 2012-03-22 ENCOUNTER — Other Ambulatory Visit: Payer: Self-pay | Admitting: Lab

## 2012-03-22 ENCOUNTER — Ambulatory Visit: Payer: Self-pay | Admitting: Oncology

## 2012-03-28 ENCOUNTER — Ambulatory Visit (HOSPITAL_BASED_OUTPATIENT_CLINIC_OR_DEPARTMENT_OTHER): Payer: Medicare Other | Admitting: Oncology

## 2012-03-28 ENCOUNTER — Ambulatory Visit (HOSPITAL_BASED_OUTPATIENT_CLINIC_OR_DEPARTMENT_OTHER): Payer: Medicare Other | Admitting: Lab

## 2012-03-28 ENCOUNTER — Other Ambulatory Visit: Payer: Self-pay | Admitting: Medical Oncology

## 2012-03-28 ENCOUNTER — Encounter: Payer: Self-pay | Admitting: Oncology

## 2012-03-28 ENCOUNTER — Encounter: Payer: Self-pay | Admitting: *Deleted

## 2012-03-28 VITALS — BP 147/81 | HR 94 | Temp 97.3°F | Resp 20 | Ht 64.0 in | Wt 102.2 lb

## 2012-03-28 DIAGNOSIS — C7951 Secondary malignant neoplasm of bone: Secondary | ICD-10-CM

## 2012-03-28 DIAGNOSIS — C50919 Malignant neoplasm of unspecified site of unspecified female breast: Secondary | ICD-10-CM

## 2012-03-28 DIAGNOSIS — Z170 Estrogen receptor positive status [ER+]: Secondary | ICD-10-CM

## 2012-03-28 DIAGNOSIS — C787 Secondary malignant neoplasm of liver and intrahepatic bile duct: Secondary | ICD-10-CM

## 2012-03-28 LAB — COMPREHENSIVE METABOLIC PANEL (CC13)
Alkaline Phosphatase: 96 U/L (ref 40–150)
BUN: 19 mg/dL (ref 7.0–26.0)
CO2: 26 mEq/L (ref 22–29)
Creatinine: 0.8 mg/dL (ref 0.6–1.1)
Glucose: 104 mg/dl — ABNORMAL HIGH (ref 70–99)
Sodium: 142 mEq/L (ref 136–145)
Total Bilirubin: 0.54 mg/dL (ref 0.20–1.20)

## 2012-03-28 LAB — CBC WITH DIFFERENTIAL/PLATELET
BASO%: 0.5 % (ref 0.0–2.0)
Basophils Absolute: 0 10*3/uL (ref 0.0–0.1)
EOS%: 1.1 % (ref 0.0–7.0)
HCT: 33.1 % — ABNORMAL LOW (ref 34.8–46.6)
HGB: 10.7 g/dL — ABNORMAL LOW (ref 11.6–15.9)
LYMPH%: 23 % (ref 14.0–49.7)
MCH: 27.7 pg (ref 25.1–34.0)
MCHC: 32.3 g/dL (ref 31.5–36.0)
MCV: 85.7 fL (ref 79.5–101.0)
MONO%: 12.5 % (ref 0.0–14.0)
NEUT%: 62.9 % (ref 38.4–76.8)
Platelets: 205 10*3/uL (ref 145–400)
lymph#: 1.4 10*3/uL (ref 0.9–3.3)

## 2012-03-28 MED ORDER — EXEMESTANE 25 MG PO TABS: 25.0000 mg | ORAL_TABLET | Freq: Every day | ORAL | Status: AC

## 2012-03-28 MED ORDER — EVEROLIMUS 5 MG PO TABS: 2.5000 mg | ORAL_TABLET | Freq: Every day | ORAL | Status: AC

## 2012-03-28 NOTE — Progress Notes (Signed)
CC:   Elizabeth [name], [name], [name] Elizabeth [name], [name]  PROBLEM LIST:  1. Metastatic breast cancer originating in the right breast, neglected  primary with diagnosis going back to October 2008. At presentation  the patient had a right trapped lung with pleural disease,  extensive bone metastases and probable liver metastases. Biopsy  showed invasive ductal cancer. Estrogen receptor 91%, progesterone  receptor 1%, HER-2/neu was 1+. At that time the patient was having  fairly severe pain in her back. Also had impending T10 cord  compression. She received radiation treatments to the thoracic  spine from T7-T12 from 02/02/2007 through 02/21/2007. She was  started on Femara 2.5 mg a day on 02/09/2007 and Zometa on  03/28/2010 after receiving dental clearance. The patient was noted  to have progression on her right chest wall in March 2011 and  received radiation treatments to the right chest wall 2000 cGy in  10 fractions from 10/10/2009 through 10/23/2009.  She does have elevated tumor markers, specifically CEA and CA 27.29. On 02/09/2007, CA27.29 was 2883 and the CEA was 3331.  The patient received Femara 2.5 mg daily from 02/09/2007 through  07/28/2011 initially with a dramatic response but in recent months  has had slow progression. The patient received Faslodex 500 mg IM from 07/28/2011 through 03/03/2012.  She had excellent control of her disease and a partial response as evidenced by tumor markers.  The PET scan from 03/11/2012 compared with the PET scan from 01/13/2011 shows progressive disease. Tumor markers are also increasing.  The PET scan shows new metastatic disease within the liver.  These lesions are intensely hypermetabolic and by CT measure 1.5 to 4 cm.  In addition, the patient had a mixed response within the bones with several new sites of activity.  There is also new activity within the inferior right maxilla with a focus of bone destruction just above the alveolar  ridge and intense hypermetabolic activity.  There was no evidence for pulmonary metastatic disease.  The patient will be started on Aromasin in combination with Afinitor.   2. Hearing impairment.  3. Glaucoma.  4. Gallstones evident on CT scan.  5. Weight loss. The patient weighed approximately 120 pounds prior to  diagnosis of metastatic breast cancer in October 2008. Her weight has  been progressively decreasing over the past few years. The patient  complains of anorexia.    MEDICATIONS:  1. Lumigan 0.01% solution 1 drop into left eye at bedtime.  2. Cosopt 1 drop into left eye twice daily.  3. Multivitamins 1 a day.  4. Faslodex 500 mg IM was given monthly after a q2 week-induction  period during the first 4 weeks. Faslodex was given from 07/28/2011 through 03/03/2012 initially with partial response, subsequently with progressive disease, confirmed by PET scan on 03/11/2012.  5. Zometa 3.5 mg IV every 2 months. Zometa was given from 03/28/2010 through 03/03/2012.   SMOKING HISTORY: The patient has never smoked cigarettes.    HISTORY:  [name] was seen today for followup of her metastatic breast cancer with widespread involvement and a PET scan on 03/11/2012 that showed progressive disease.  The patient was accompanied by her son, Elizabeth [name].  She was last seen by [place] on 03/03/2012 at which time she received Faslodex and Zometa.  The patient has been called in primarily for discussion  Clinically she denies any changes in her condition.  Specifically she denies any pain or trouble breathing.  She denies any pain on chewing.  Her  appetite is good and in fact she has gained 3 or 4 pounds since her last visit here almost 4 weeks ago.  As stated, she is without complaints today.  PHYSICAL EXAMINATION:  The patient shows no change.  Weight today is 102.2 pounds, height 5 feet 4 inches, body surface area 1.45 m2.  Blood pressure 147/81.  Other vital signs are normal.  Her  physical exam really shows no major changes.  I think the ulcer on the chest wall may be a little bigger measuring today about 3.25 cm x 3 cm.  There is no swelling around the left maxilla or left side of the face.  I could not see any lesions on the inside of the mouth.  There is no adenopathy.  No new nodularity involving the right chest wall.  There are decreased breath sounds at the right base as before.  Otherwise lungs are clear. Cardiac exam regular rhythm without murmur or rub.  Abdomen is benign with the patient sitting.  I could not appreciate any hepatic enlargement.  Extremities:  No peripheral edema.  The patient does not have a Port-A-Cath or central catheter.  LABORATORY DATA: Lab data from 03/03/2012 revealed mild anemia with a hemoglobin of 11.0, hematocrit 34.7.  Chemistries were entirely normal including an LDH of 212 and an albumin of 3.8.  CA27.29 had increased to 387, up from 286 on 01/07/2012.  CEA also had increased to 54.2, up from 43.1 on 01/07/2012. Most recent labs are from 03/03/2012.  Today's labs are pending.   IMAGING STUDIES:  1. CT scan of the chest, abdomen and pelvis with IV contrast on  08/22/2009 showed some improvement in the transpleural spread of  tumor involving the right hemithorax. The right effusion has  decreased in size from the prior exam of 01/31/2007. There is a  decrease in the size of the spiculated right middle lobe lesion.  There is diffuse bone metastases with some interval healing of the  destructive lesion involving the posterior elements of T10. The  right chest wall lesion had not significantly changed in size.  There was a new lesion adjacent to the chest wall lesion measuring  0.9 cm. There were gallstones noted. No focal liver  abnormalities.  2. Nuclear medicine whole bone scan from 08/22/2009 showed diffuse  osseous metastases involving the axillary appendicular skeleton.  3. CT scan of the chest, abdomen and pelvis  with IV contrast on  07/30/2010 showed no change in the right chest wall masses, similar  lung nodules, similar right-sided pleural effusion and pleural  thickening and similar osseous metastases. There was no evidence  for extra osseous metastases within the abdomen and pelvis.  Cholelithiasis was present. These scans were compared with the CT  scans of 08/14/2009.  4. Bone scan from 07/30/2010 showed widespread osseous metastatic  disease.  5. PET scan from 01/13/2011 showed hypermetabolic nodule within the  right chest wall consistent with malignancy. There was adjacent  skin thickening. There was a probable hypermetabolic nodule or  atelectasis at the right lung base. There was a stable right lower  lobe pulmonary nodule. There was widespread metastatic disease in  the skeleton that was unchanged. Several of the sclerotic lesions  were hypermetabolic consistent with active metastatic process.  6. CT scan of head without IV contrast on 02/04/2012 showed no acute  intracranial finding. Chronic sinusitis was present.  7. PET scan from 03/11/2012 showed new metastatic disease within the liver with multiple intensely hypermetabolic lesions.  By  CT scan the lesions measured 1.5 to 4 cm.  Lesions number approximately 20.  There was mixed response within the skeleton with several new sites of activity while others had improved.  Overall there was widespread skeletal metastatic disease.  There was new activity within the inferior right maxilla as compared with the prior PET scan of 01/13/2011.  There was a focus of bone destruction within the left maxillary bone just above the alveolar ridge as seen on CT scan.  No evidence for pulmonary metastatic disease.   IMPRESSION AND PLAN:  Clinically Ms. Tuitt is without change.  Her tumor markers are rising once again after initial decrease and her PET scan from 03/11/2012 shows progression as compared with the PET scan from 01/13/2011.  The  case was discussed with Dr. Pierce Crane.  He has suggested a combination of Aromasin 25 mg in combination with Afinitor 2.5 mg daily. Will look into seeing whether the patient's insurance will cover the Afinitor.  She does have Medicare with an AARP supplement.  Because of the patient's age we are utilizing a low dose of Afinitor.  We will see how the patient does clinically and by following her tumor markers.  If she shows progression of disease other options would be to consider tamoxifen or start her on chemotherapy.  Consideration for chemotherapy would include oral Xeloda or perhaps IV Taxol, Taxotere or Abraxane given in small doses every couple weeks.  To date the patient has not received any chemotherapy.  We also discussed end of life issues.  It appears that the patient does not have a living will.  I explained to her my concerns about end of life issues if her disease should progress and not respond to treatment. The patient made it clear that she does not want life support or heroic measures to extend her life if her cancer cannot be controlled and if her quality of life is not satisfactory.  Today's session was fairly extended, lasting approximately 50 minutes.  The patient will be sent to the lab.  We will plan to see her again on or about December 30 at which time we will check CBC, chemistries and CA27.29.    ______________________________ Samul Dada, [name] DSM/MEDQ  D:  03/28/2012  T:  03/28/2012  Job:  [id number]

## 2012-03-28 NOTE — Progress Notes (Signed)
This office note has been dictated.  #[id number]

## 2012-03-28 NOTE — Progress Notes (Signed)
RECEIVED A FAX FROM [facility] CONCERNING A PRIOR AUTHORIZATION FOR AFINITOR. THIS REQUEST WAS PLACED IN THE MANAGED CARE BIN. 

## 2012-03-28 NOTE — Telephone Encounter (Signed)
appts made and printed for pt aom °

## 2012-03-29 ENCOUNTER — Encounter: Payer: Self-pay | Admitting: Oncology

## 2012-03-29 ENCOUNTER — Telehealth: Payer: Self-pay | Admitting: Medical Oncology

## 2012-03-29 NOTE — Progress Notes (Signed)
Called [facility] [phone] for her 2.5 Afinitor. I spoke w/ Flint Melter the Prior Auth number is [id number]. Faxed ok to [facility].

## 2012-03-29 NOTE — Telephone Encounter (Signed)
Pt called and is concerned because she got a call about an appointment Thur 03/31/12. I told her this appointment should have been cancelled. I told her not to come and in and I cancelled her appointment.

## 2012-03-31 ENCOUNTER — Ambulatory Visit: Payer: Self-pay

## 2012-04-13 ENCOUNTER — Telehealth: Payer: Self-pay | Admitting: Oncology

## 2012-04-13 NOTE — Telephone Encounter (Signed)
s/w pt and she is aware of the time change on 12/30    anne

## 2012-04-25 ENCOUNTER — Other Ambulatory Visit: Payer: Self-pay | Admitting: Lab

## 2012-04-25 ENCOUNTER — Ambulatory Visit: Payer: Self-pay | Admitting: Oncology

## 2012-04-25 ENCOUNTER — Encounter: Payer: Self-pay | Admitting: Family

## 2012-04-25 ENCOUNTER — Other Ambulatory Visit (HOSPITAL_BASED_OUTPATIENT_CLINIC_OR_DEPARTMENT_OTHER): Payer: Medicare Other | Admitting: Lab

## 2012-04-25 ENCOUNTER — Ambulatory Visit (HOSPITAL_BASED_OUTPATIENT_CLINIC_OR_DEPARTMENT_OTHER): Payer: Medicare Other | Admitting: Family

## 2012-04-25 VITALS — BP 143/86 | HR 93 | Temp 97.3°F | Resp 20 | Ht 64.0 in | Wt 100.7 lb

## 2012-04-25 DIAGNOSIS — L98499 Non-pressure chronic ulcer of skin of other sites with unspecified severity: Secondary | ICD-10-CM

## 2012-04-25 DIAGNOSIS — C50919 Malignant neoplasm of unspecified site of unspecified female breast: Secondary | ICD-10-CM

## 2012-04-25 DIAGNOSIS — C7951 Secondary malignant neoplasm of bone: Secondary | ICD-10-CM

## 2012-04-25 DIAGNOSIS — C7952 Secondary malignant neoplasm of bone marrow: Secondary | ICD-10-CM

## 2012-04-25 DIAGNOSIS — C787 Secondary malignant neoplasm of liver and intrahepatic bile duct: Secondary | ICD-10-CM

## 2012-04-25 LAB — COMPREHENSIVE METABOLIC PANEL (CC13)
ALT: 11 U/L (ref 0–55)
AST: 29 U/L (ref 5–34)
Albumin: 3.1 g/dL — ABNORMAL LOW (ref 3.5–5.0)
BUN: 16 mg/dL (ref 7.0–26.0)
CO2: 26 mEq/L (ref 22–29)
Calcium: 8.9 mg/dL (ref 8.4–10.4)
Chloride: 106 mEq/L (ref 98–107)
Creatinine: 0.9 mg/dL (ref 0.6–1.1)
Potassium: 3.8 mEq/L (ref 3.5–5.1)

## 2012-04-25 LAB — CBC WITH DIFFERENTIAL/PLATELET
Basophils Absolute: 0 10*3/uL (ref 0.0–0.1)
Eosinophils Absolute: 0.1 10*3/uL (ref 0.0–0.5)
HCT: 32.3 % — ABNORMAL LOW (ref 34.8–46.6)
HGB: 10.8 g/dL — ABNORMAL LOW (ref 11.6–15.9)
LYMPH%: 31.2 % (ref 14.0–49.7)
MCV: 81.1 fL (ref 79.5–101.0)
MONO#: 0.6 10*3/uL (ref 0.1–0.9)
MONO%: 12.3 % (ref 0.0–14.0)
NEUT#: 2.7 10*3/uL (ref 1.5–6.5)
Platelets: 245 10*3/uL (ref 145–400)
WBC: 4.8 10*3/uL (ref 3.9–10.3)

## 2012-04-25 LAB — LACTATE DEHYDROGENASE (CC13): LDH: 254 U/L — ABNORMAL HIGH (ref 125–245)

## 2012-04-25 MED ORDER — FULVESTRANT 250 MG/5ML IM SOLN
500.0000 mg | Freq: Once | INTRAMUSCULAR | Status: DC
  Filled 2012-04-25: qty 10

## 2012-04-25 MED ORDER — HEPARIN SOD (PORK) LOCK FLUSH 100 UNIT/ML IV SOLN
500.0000 [IU] | Freq: Once | INTRAVENOUS | Status: DC | PRN
  Filled 2012-04-25: qty 5

## 2012-04-25 NOTE — Progress Notes (Signed)
Patient ID: Elizabeth [name], female   DOB: December 12, 1924, 78 y.o.   MRN: [id number] CSN: [id number]  CC: [name], [name], [name]  [name], [name]  Problem List: Elizabeth [name] is a 78 y.o. Caucasian female with a problem list consisting of:  1. Metastatic breast cancer originating in the right breast, neglected primary with diagnosis going back to October 2008. At presentation  the patient had a right trapped lung with pleural disease, extensive bone metastases and probable liver metastases. Biopsy showed invasive ductal cancer. Estrogen receptor 91%, progesterone receptor 1%, HER-2/neu was 1+. At that time the patient was having fairly severe pain in her back. Also had impending T10 cord compression. She received radiation treatments to the thoracic spine from T7-T12 from 02/02/2007 through 02/21/2007. She was started on Femara 2.5 mg a day on 02/09/2007 and Zometa on 03/28/2010 after receiving dental clearance. The patient was noted to have progression on her right chest wall in March 2011 and received radiation treatments to the right chest wall 2000 cGy in 10 fractions from 10/10/2009 through 10/23/2009. She does have elevated tumor markers, specifically CEA and CA 27.29. On 02/09/2007, CA27.29 was 2883 and the CEA was 3331. The patient received Femara 2.5 mg daily from 02/09/2007 through 07/28/2011 initially with a dramatic response but in recent months has had slow progression. The patient received Faslodex 500 mg IM from 07/28/2011 through 02/08/2012. She had excellent control of her disease and a partial response as evidenced by tumor markers. The PET scan from 03/11/2012 compared with the PET scan from 01/13/2011 shows progressive disease. Tumor markers are also increasing. The PET scan shows new metastatic disease within the liver. These lesions are intensely hypermetabolic and by CT measure 1.5 to 4 cm. In addition, the patient had a mixed response within the bones with several new  sites of activity. There is also new activity within the inferior right maxilla with a focus of bone destruction just above the alveolar ridge and intense hypermetabolic activity. There was no evidence for pulmonary metastatic disease. The patient was started on Aromasin 25 mg PO daily (early-December 2013) in combination with Afinitor 2.5 mg PO daily (mid-December 2013) .  2. Hearing impairment 3. Glaucoma 4. Gallstones evident on CT scan 5. Weight loss. The patient weighed approximately 120 pounds prior to diagnosis of metastatic breast cancer in October 2008. Her weight has been progressively decreasing over the past few years. The patient complains of anorexia. 6.  DNR/NCB as of 03/28/2012  Dr. Arline Asp and I saw Elizabeth [name] today for follow up of her metastatic breast cancer with widespread involvement and a PET scan on 03/11/2012 that showed progressive disease. The patient was accompanied by her Elizabeth [name]. She was last seen by [place] on 03/28/2012.  Her treatment plan was changed on 03/28/2012 by Dr. Arline Asp to Afinitor (Everolimus) 2.5 mg PO daily and Aromasin (Exemestane) 25 mg PO daily.  Clinically she denies any changes in her condition.  Specifically she denies any pain or trouble breathing.  Her appetite is good, but she has lost 1 pound since her last office visit.   Elizabeth [name] is without complaint today and denies any symptomatology other than pruritis by her right chest ulcer.   Past Medical History: Past Medical History  Diagnosis Date  . Breast cancer     (Rt) breast ca dx 2008  . Glaucoma(365)   . Breast lump   . Weight decrease   . Cord compression  T10  . HOH (hard of hearing)   . Gallstones     Surgical History: Past Surgical History  Procedure Date  . Cataract extraction     Right eye    Current Medications: Current Outpatient Prescriptions  Medication Sig Dispense Refill  . bimatoprost (LUMIGAN) 0.01 % SOLN Place 1 drop into the left eye at  bedtime.       . dorzolamide-timolol (COSOPT) 22.3-6.8 MG/ML ophthalmic solution Place 1 drop into the left eye 2 (two) times daily.       Marland Kitchen ENSURE (ENSURE) Take 1 Can by mouth daily.      Marland Kitchen everolimus (AFINITOR) 5 MG tablet Take 0.5 tablets (2.5 mg total) by mouth daily.  30 tablet  3  . exemestane (AROMASIN) 25 MG tablet Take 1 tablet (25 mg total) by mouth daily after breakfast.  30 tablet  3  . Multiple Vitamin (MULTIVITAMIN) tablet Take 1 tablet by mouth daily.        Allergies: No Known Allergies  Family History: Family History  Problem Relation Age of Onset  . Cancer Daughter     breast cancer  . Glaucoma Father     Social History: History  Substance Use Topics  . Smoking status: Never Smoker   . Smokeless tobacco: Never Used  . Alcohol Use: No    Review of Systems: 10 Point review of systems was completed and is negative except as noted above.   Physical Exam:   Blood pressure 143/86, pulse 93, temperature 97.3 F (36.3 C), temperature source Oral, resp. rate 20, height 5\' 4"  (1.626 m), weight 100 lb 11.2 oz (45.677 kg).  General appearance: Alert, cooperative, thin frame, no apparent distress Head: Normocephalic, without obvious abnormality, atraumatic Eyes: Conjunctivae clear, arcus senilis, pupils are unequal L > R,  PERRLA, EOMI Ears:  HOH bilaterally Nose: Nares, septum and mucosa are normal, no drainage or sinus tenderness Neck: No adenopathy, supple, symmetrical, trachea midline and thyroid not enlarged, symmetric, no tenderness/mass/nodules Resp: Clear to auscultation bilaterally, diminished bibasilar breath sounds Breasts: Left breast normal appearance, pendulous without masses or tenderness, right breast is surgically absent with a 3.0 x 3.5 cm right chest wall ulcer.  Cardio: Regular rate and rhythm, S1, S2 normal, no murmur, click, rub or gallop GI: Soft, non-tender, hypoactive bowel sounds normal, no organomegaly Extremities: Extremities normal,  atraumatic, no cyanosis or edema Lymph nodes: Cervical, supraclavicular, and axillary nodes normal Neurologic: Grossly normal   Laboratory Data: Results for orders placed in visit on 04/25/12 (from the past 48 hour(s))  CBC WITH DIFFERENTIAL     Status: Abnormal   Collection Time   04/25/12  1:44 PM      Component Value Range Comment   WBC 4.8  3.9 - 10.3 10e3/uL    NEUT# 2.7  1.5 - 6.5 10e3/uL    HGB 10.8 (*) 11.6 - 15.9 g/dL    HCT 40.9 (*) 81.1 - 46.6 %    Platelets 245  145 - 400 10e3/uL    MCV 81.1  79.5 - 101.0 fL    MCH 27.1  25.1 - 34.0 pg    MCHC 33.4  31.5 - 36.0 g/dL    RBC 9.14  7.82 - 9.56 10e6/uL    RDW 15.2 (*) 11.2 - 14.5 %    lymph# 1.5  0.9 - 3.3 10e3/uL    MONO# 0.6  0.1 - 0.9 10e3/uL    Eosinophils Absolute 0.1  0.0 - 0.5 10e3/uL    Basophils Absolute  0.0  0.0 - 0.1 10e3/uL    NEUT% 55.0  38.4 - 76.8 %    LYMPH% 31.2  14.0 - 49.7 %    MONO% 12.3  0.0 - 14.0 %    EOS% 1.1  0.0 - 7.0 %    BASO% 0.4  0.0 - 2.0 %   COMPREHENSIVE METABOLIC PANEL (CC13)     Status: Abnormal   Collection Time   04/25/12  1:44 PM      Component Value Range Comment   Sodium 142  136 - 145 mEq/L    Potassium 3.8  3.5 - 5.1 mEq/L    Chloride 106  98 - 107 mEq/L    CO2 26  22 - 29 mEq/L    Glucose 141 (*) 70 - 99 mg/dl    BUN 16.1  7.0 - 09.6 mg/dL    Creatinine 0.9  0.6 - 1.1 mg/dL    Total Bilirubin 0.45  0.20 - 1.20 mg/dL    Alkaline Phosphatase 86  40 - 150 U/L    AST 29  5 - 34 U/L    ALT 11  0 - 55 U/L    Total Protein 7.6  6.4 - 8.3 g/dL    Albumin 3.1 (*) 3.5 - 5.0 g/dL    Calcium 8.9  8.4 - 40.9 mg/dL   LACTATE DEHYDROGENASE (CC13)     Status: Abnormal   Collection Time   04/25/12  1:44 PM      Component Value Range Comment   LDH 254 (*) 125 - 245 U/L 03/03/12 - NOTE new reference range.     Imaging Studies: 1. CT scan of the chest, abdomen and pelvis with IV contrast on 08/22/2009 showed some improvement in the transpleural spread of  tumor involving the  right hemithorax. The right effusion has decreased in size from the prior exam of 01/31/2007. There is a decrease in the size of the spiculated right middle lobe lesion. There is diffuse bone metastases with some interval healing of the destructive lesion involving the posterior elements of T10. The right chest wall lesion had not significantly changed in size. There was a new lesion adjacent to the chest wall lesion measuring 0.9 cm. There were gallstones noted. No focal liver abnormalities.  2. Nuclear medicine whole bone scan from 08/22/2009 showed diffuse osseous metastases involving the axillary appendicular skeleton.  3. CT scan of the chest, abdomen and pelvis with IV contrast on 07/30/2010 showed no change in the right chest wall masses, similar  lung nodules, similar right-sided pleural effusion and pleural thickening and similar osseous metastases. There was no evidence for extra osseous metastases within the abdomen and pelvis. Cholelithiasis was present. These scans were compared with the CT scans of 08/14/2009.  4. Bone scan from 07/30/2010 showed widespread osseous metastatic disease.  5. PET scan from 01/13/2011 showed hypermetabolic nodule within the right chest wall consistent with malignancy. There was adjacent skin thickening. There was a probable hypermetabolic nodule or atelectasis at the right lung base. There was a stable right lower lobe pulmonary nodule. There was widespread metastatic disease in the skeleton that was unchanged. Several of the sclerotic lesions were hypermetabolic consistent with active metastatic process.  6. CT scan of head without IV contrast on 02/04/2012 showed no acute intracranial finding. Chronic sinusitis was present.  7. PET scan from 03/11/2012 showed new metastatic disease within the liver with multiple intensely hypermetabolic lesions. By CT scan the lesions measured 1.5 to 4 cm. Lesions number approximately  20. There was mixed response within the  skeleton with several new sites of activity while others had improved. Overall there was widespread skeletal metastatic disease. There was new activity within the inferior right maxilla as compared with the prior PET scan of 01/13/2011. There was a focus of bone destruction within the left maxillary bone just above the alveolar ridge as seen on CT scan. No evidence for pulmonary metastatic disease.   Impression/Plan: Clinically Elizabeth [name] is without change. Her tumor markers are rising after initial decrease and her PET scan from 03/11/2012 shows progression as compared with the PET scan from 01/13/2011.  Dr. Arline Asp discussed this case with Dr. Pierce Crane. He suggested a  combination of Aromasin 25 mg in combination with Afinitor 2.5 mg daily.  Because of the patient's age a low dose of Afinitor was utilized.  Elizabeth [name] and her grandson stated she started both of the new medications earlier this month and she has not experienced any side effects.  A consult for wound care has been submitted for the ulceration in her right chest area.  Elizabeth [name] has been asked to increase her nutritional intake and to consume 2 Ensure supplemental drinks daily if possible as not to lose any more weight.    Dr. Arline Asp discussed end of life issues with Elizabeth [name] during her last office visit.  The patient made it clear to him that she does not want life support or heroic measures to extend her life if her cancer cannot be controlled and if her quality of life is not satisfactory.  She requested a DNR/NCB status.   We will plan to see her again on 06/09/2012 at which time we will check CBC, chemistries and CEA and CA 27.29.  Elizabeth [name] and her Elizabeth [name] are encouraged to contact us in the interim if they have any questions or concerns.    Larina Bras, NP-C 04/25/2012, 3:48 PM

## 2012-04-25 NOTE — Patient Instructions (Addendum)
Please contact us at [phone] if you have any questions or concerns.  Please don't lose any more weight.  Try to drink 2 Ensure drinks daily if possible. Take all medications as prescribed.

## 2012-04-28 ENCOUNTER — Ambulatory Visit: Payer: Self-pay

## 2012-04-28 ENCOUNTER — Other Ambulatory Visit: Payer: Self-pay | Admitting: Lab

## 2012-04-28 ENCOUNTER — Ambulatory Visit: Payer: Self-pay | Admitting: Oncology

## 2012-05-02 ENCOUNTER — Ambulatory Visit: Payer: Self-pay

## 2012-05-02 ENCOUNTER — Other Ambulatory Visit: Payer: Self-pay | Admitting: Lab

## 2012-05-02 ENCOUNTER — Ambulatory Visit: Payer: Self-pay | Admitting: Oncology

## 2012-05-09 ENCOUNTER — Telehealth: Payer: Self-pay | Admitting: Oncology

## 2012-05-09 NOTE — Telephone Encounter (Signed)
called to cx ther wound care appt which was 1/16 at 1:00,they req to see the dr one more time as they feel her wound is healing     Elizabeth [name]

## 2012-06-09 ENCOUNTER — Ambulatory Visit: Payer: Self-pay | Admitting: Oncology

## 2012-06-09 ENCOUNTER — Other Ambulatory Visit: Payer: Self-pay | Admitting: Lab

## 2012-06-09 ENCOUNTER — Telehealth: Payer: Self-pay | Admitting: Oncology

## 2012-06-09 ENCOUNTER — Other Ambulatory Visit: Payer: Self-pay

## 2012-06-09 NOTE — Telephone Encounter (Signed)
s.w. pt granson and advised on 2.17.14 appt....pt ok

## 2012-06-13 ENCOUNTER — Ambulatory Visit (HOSPITAL_BASED_OUTPATIENT_CLINIC_OR_DEPARTMENT_OTHER): Payer: Medicare Other | Admitting: Oncology

## 2012-06-13 ENCOUNTER — Other Ambulatory Visit: Payer: Self-pay | Admitting: Medical Oncology

## 2012-06-13 ENCOUNTER — Encounter: Payer: Self-pay | Admitting: Oncology

## 2012-06-13 ENCOUNTER — Other Ambulatory Visit (HOSPITAL_BASED_OUTPATIENT_CLINIC_OR_DEPARTMENT_OTHER): Payer: Medicare Other | Admitting: Lab

## 2012-06-13 VITALS — BP 139/73 | HR 94 | Temp 98.6°F | Resp 20 | Ht 64.0 in | Wt 97.5 lb

## 2012-06-13 DIAGNOSIS — C50919 Malignant neoplasm of unspecified site of unspecified female breast: Secondary | ICD-10-CM

## 2012-06-13 DIAGNOSIS — L98499 Non-pressure chronic ulcer of skin of other sites with unspecified severity: Secondary | ICD-10-CM

## 2012-06-13 DIAGNOSIS — R630 Anorexia: Secondary | ICD-10-CM

## 2012-06-13 DIAGNOSIS — C7951 Secondary malignant neoplasm of bone: Secondary | ICD-10-CM

## 2012-06-13 DIAGNOSIS — C792 Secondary malignant neoplasm of skin: Secondary | ICD-10-CM

## 2012-06-13 LAB — CBC WITH DIFFERENTIAL/PLATELET
BASO%: 0.3 % (ref 0.0–2.0)
Eosinophils Absolute: 0 10*3/uL (ref 0.0–0.5)
HCT: 30.8 % — ABNORMAL LOW (ref 34.8–46.6)
MCHC: 32.4 g/dL (ref 31.5–36.0)
MONO#: 0.5 10*3/uL (ref 0.1–0.9)
NEUT#: 2.8 10*3/uL (ref 1.5–6.5)
RBC: 4.1 10*6/uL (ref 3.70–5.45)
WBC: 4.6 10*3/uL (ref 3.9–10.3)
lymph#: 1.3 10*3/uL (ref 0.9–3.3)

## 2012-06-13 LAB — LACTATE DEHYDROGENASE (CC13): LDH: 244 U/L (ref 125–245)

## 2012-06-13 LAB — COMPREHENSIVE METABOLIC PANEL (CC13)
ALT: 13 U/L (ref 0–55)
AST: 28 U/L (ref 5–34)
Albumin: 2.8 g/dL — ABNORMAL LOW (ref 3.5–5.0)
Alkaline Phosphatase: 82 U/L (ref 40–150)
Potassium: 3.4 mEq/L — ABNORMAL LOW (ref 3.5–5.1)
Sodium: 141 mEq/L (ref 136–145)
Total Protein: 8.1 g/dL (ref 6.4–8.3)

## 2012-06-13 NOTE — Progress Notes (Signed)
This office note has been dictated.  #[id number]

## 2012-06-13 NOTE — Telephone Encounter (Signed)
Attempted to call pt with potassium results of 3.4. There is not answer or machine. Will try later.

## 2012-06-13 NOTE — Progress Notes (Signed)
CC:   [name], [name], [name] [name], [name]  PROBLEM LIST:  1. Metastatic breast cancer originating in the right breast, neglected  primary with diagnosis going back to October 2008. At presentation  the patient had a right trapped lung with pleural disease,  extensive bone metastases and probable liver metastases. Biopsy  showed invasive ductal cancer. Estrogen receptor 91%, progesterone  receptor 1%, HER-2/neu was 1+. At that time the patient was having  fairly severe pain in her back. Also had impending T10 cord  compression. She received radiation treatments to the thoracic  spine from T7-T12 from 02/02/2007 through 02/21/2007. She was  started on Femara 2.5 mg a day on 02/09/2007 and Zometa on  03/28/2010 after receiving dental clearance. The patient was noted  to have progression on her right chest wall in March 2011 and  received radiation treatments to the right chest wall 2000 cGy in  10 fractions from 10/10/2009 through 10/23/2009.  She does have elevated tumor markers, specifically CEA and CA 27.29. On 02/09/2007, CA27.29 was 2883 and the CEA was 3331.  The patient received Femara 2.5 mg daily from 02/09/2007 through  07/28/2011 initially with a dramatic response but in recent months  has had slow progression. The patient received Faslodex 500 mg IM from 07/28/2011 through 03/03/2012. She had excellent control of her disease and a partial response as evidenced by tumor markers. The PET scan from 03/11/2012  compared with the PET scan from 01/13/2011 shows progressive disease.  Tumor markers are also increasing. The PET scan shows new metastatic  disease within the liver. These lesions are intensely hypermetabolic  and by CT measure 1.5 to 4 cm. In addition, the patient had a mixed  response within the bones with several new sites of activity. There is  also new activity within the inferior right maxilla with a focus of bone  destruction just above the alveolar  ridge and intense hypermetabolic  activity. There was no evidence for pulmonary metastatic disease. The patient was started on Aromasin in combination with Afinitor in early December 2013.  2. Hearing impairment.  3. Glaucoma.  4. Gallstones evident on CT scan.  5. Weight loss. The patient weighed approximately 120 pounds prior to  diagnosis of metastatic breast cancer in October 2008. Her weight has  been progressively decreasing over the past few years. The patient  complains of anorexia. 6. Nonhealing ulcer on right chest wall dating back at least a couple of years. 7. DNR/NCB discussed on office visit of 03/28/2012.    MEDICATIONS:  Reviewed and recorded. Current Outpatient Prescriptions  Medication Sig Dispense Refill  . bimatoprost (LUMIGAN) 0.01 % SOLN Place 1 drop into the left eye at bedtime.       . dorzolamide-timolol (COSOPT) 22.3-6.8 MG/ML ophthalmic solution Place 1 drop into the left eye 2 (two) times daily.       Marland Kitchen ENSURE (ENSURE) Take 1 Can by mouth daily.      Marland Kitchen everolimus (AFINITOR) 5 MG tablet Take 0.5 tablets (2.5 mg total) by mouth daily.  30 tablet  3  . exemestane (AROMASIN) 25 MG tablet Take 1 tablet (25 mg total) by mouth daily after breakfast.  30 tablet  3  . Multiple Vitamin (MULTIVITAMIN) tablet Take 1 tablet by mouth daily.       No current facility-administered medications for this visit.   -Aromasin 25 mg daily started in early December 2013. -Afinitor 2.5 mg daily started in mid December 2013. -Zometa 3.5 mg IV  was given every 2 months from 03/28/2010 through 03/03/2012.  SMOKING HISTORY:  The patient has never smoked cigarettes.   HISTORY:  I saw Filiberto Pinks today for followup of her metastatic breast cancer with widespread involvement involving bones, right chest wall, right lung, and possibly liver.  The PET scan from 03/11/2012 showed progressive disease.  Mrs. Vanderloop was last seen by [place] on 04/25/2012 and prior to that  on 03/28/2012.  She was accompanied by her grandson, [name].  As always, the patient is without complaints.  She says that she is taking in 1 can of Ensure daily.  Unfortunately, she has lost another few pounds.  She has had at least 1 or more nutritional consult.  She was supposed to have a wound consult, but apparently canceled this about a month ago.  Aside from the weight loss, there do not appear to be any major changes.  PHYSICAL EXAMINATION:  General:  The patient is frail and elderly.  She needs some assistance getting up and down from the examining table. Weight today is 97.5 pounds, height 5 feet 4 inches, body surface area 1.41 sq m.  Vital Signs:  Blood pressure 139/73.  Other vital signs are normal.  HEENT:  There is no scleral icterus.  Mouth and pharynx are benign.  There is no peripheral adenopathy palpable.  Heart and lungs: Normal.  Chest:  The ulcer on the right chest wall is clearly larger than it had been, now measuring about 4.0 x 4.0 cm.  Margins are very clean.  There is no nodularity around the margin or at the base of this shallow ulcer.  The also looks fairly clean.  No other definite changes on the chest wall which has undergone radiation treatments.  Abdomen: Benign with no organomegaly or masses palpable.  Extremities:  No peripheral edema.  Neurologic:  Normal.  Access:  The patient does not have a Port-A-Cath or central catheter.  LABORATORY DATA:  Today, white count 4.6, ANC 2.8, hemoglobin 10.0, hematocrit 30.8, platelets 261,000.  Chemistries notable for an albumin of 2.8, down from 3.1 on 04/25/2012.  The potassium was 3.4.  Liver function tests are normal including an LDH of 244.  CEA and CA27.29 are pending.  On 04/25/2012 CEA was 65.6 compared with 62.4 on 03/28/2012. CA27.29 was 456 as compared with 419 on 03/28/2012.  IMAGING STUDIES:  1. CT scan of the chest, abdomen and pelvis with IV contrast on  08/22/2009 showed some improvement in the  transpleural spread of  tumor involving the right hemithorax. The right effusion has  decreased in size from the prior exam of 01/31/2007. There is a  decrease in the size of the spiculated right middle lobe lesion.  There is diffuse bone metastases with some interval healing of the  destructive lesion involving the posterior elements of T10. The  right chest wall lesion had not significantly changed in size.  There was a new lesion adjacent to the chest wall lesion measuring  0.9 cm. There were gallstones noted. No focal liver  abnormalities.  2. Nuclear medicine whole bone scan from 08/22/2009 showed diffuse  osseous metastases involving the axillary appendicular skeleton.  3. CT scan of the chest, abdomen and pelvis with IV contrast on  07/30/2010 showed no change in the right chest wall masses, similar  lung nodules, similar right-sided pleural effusion and pleural  thickening and similar osseous metastases. There was no evidence  for extra osseous metastases within the abdomen and pelvis.  Cholelithiasis  was present. These scans were compared with the CT  scans of 08/14/2009.  4. Bone scan from 07/30/2010 showed widespread osseous metastatic  disease.  5. PET scan from 01/13/2011 showed hypermetabolic nodule within the  right chest wall consistent with malignancy. There was adjacent  skin thickening. There was a probable hypermetabolic nodule or  atelectasis at the right lung base. There was a stable right lower  lobe pulmonary nodule. There was widespread metastatic disease in  the skeleton that was unchanged. Several of the sclerotic lesions  were hypermetabolic consistent with active metastatic process.  6. CT scan of head without IV contrast on 02/04/2012 showed no acute  intracranial finding. Chronic sinusitis was present.   7. PET scan from 03/11/2012 showed new metastatic disease within the liver with  multiple intensely hypermetabolic lesions. By CT scan the lesions   measured 1.5 to 4 cm. Lesions number approximately 20. There was mixed  response within the skeleton with several new sites of activity while  others had improved. Overall there was widespread skeletal metastatic  disease. There was new activity within the inferior right maxilla as  compared with the prior PET scan of 01/13/2011. There was a focus of  bone destruction within the left maxillary bone just above the alveolar  ridge as seen on CT scan. No evidence for pulmonary metastatic disease.   IMPRESSION AND PLAN:  Mrs. Beshears weight continues to decrease.  She was given samples of Ensure Complete containing 350 kilocalories per serving.  The patient has been urged to take 2 or 3 of these in a day. She is taking in 1 a day.  Once again, we will make an appointment with the wound center to help [place] manage the ulcer on her right chest wall.  As noted above, the patient has been taking Aromasin and Afinitor for approximately 2 months.  We are following tumor markers.  At some point, we will need to repeat either CT scans or another PET scan to determine whether she is responding.  We may want to consider increasing the dose of Afinitor. She seems to be tolerating this quite well.  It is unclear whether the patient is deriving any benefit from these medicines at this time.  She seems to be asymptomatic.  The patient has never received any chemotherapy previously.  She certainly might be able to tolerate capecitabine (Xeloda) and possibly even small doses of Taxol or Taxotere given every couple of weeks.  We will plan to see Mrs. Peabody again in approximately 6 weeks, which should be somewhere around March 31st, at which time we will check CBC, chemistries, and tumor markers.  Patient can see Marlowe Kays at that time.    ______________________________ Samul Dada, [name] DSM/MEDQ  D:  06/13/2012  T:  06/13/2012  Job:  [id number]

## 2012-06-14 LAB — CEA: CEA: 87.7 ng/mL — ABNORMAL HIGH (ref 0.0–5.0)

## 2012-06-20 ENCOUNTER — Encounter (HOSPITAL_BASED_OUTPATIENT_CLINIC_OR_DEPARTMENT_OTHER): Payer: Medicare Other | Attending: General Surgery

## 2012-06-20 DIAGNOSIS — C7952 Secondary malignant neoplasm of bone marrow: Secondary | ICD-10-CM | POA: Insufficient documentation

## 2012-06-20 DIAGNOSIS — C50919 Malignant neoplasm of unspecified site of unspecified female breast: Secondary | ICD-10-CM | POA: Insufficient documentation

## 2012-06-20 DIAGNOSIS — C787 Secondary malignant neoplasm of liver and intrahepatic bile duct: Secondary | ICD-10-CM | POA: Insufficient documentation

## 2012-06-20 DIAGNOSIS — L98499 Non-pressure chronic ulcer of skin of other sites with unspecified severity: Secondary | ICD-10-CM | POA: Insufficient documentation

## 2012-06-20 DIAGNOSIS — C7951 Secondary malignant neoplasm of bone: Secondary | ICD-10-CM | POA: Insufficient documentation

## 2012-06-20 DIAGNOSIS — Y842 Radiological procedure and radiotherapy as the cause of abnormal reaction of the patient, or of later complication, without mention of misadventure at the time of the procedure: Secondary | ICD-10-CM | POA: Insufficient documentation

## 2012-06-20 DIAGNOSIS — T66XXXS Radiation sickness, unspecified, sequela: Secondary | ICD-10-CM | POA: Insufficient documentation

## 2012-06-21 NOTE — Progress Notes (Signed)
[facility]  NAME:  Elizabeth [name], Elizabeth [name]                ACCOUNT NO.:  [id number]  MEDICAL RECORD NO.:  [id number]      DATE OF BIRTH:  1924-06-21  PHYSICIAN:  Ardath Sax, [name]           VISIT DATE:                                  OFFICE VISIT   This is an 79 year old lady who is here because of a nonhealing ulcer on her right chest wall.  This woman has a horrible case of metastatic breast cancer.  She has got osteo metastasis and liver metastasis.  She has lost a lot of weight and is eating poorly.  She had her right breast removed and was treated with radiation and chemotherapy.  It has not helped her at all.  She has an ulcer, which is from the radiation on her right chest wall, and it is about 4 cm in diameter.  She was sent here by her doctors to see if we could somehow get this area to heal.  I am calling it late effects of radiation as the etiology for this ulcer and I am going to treat it with Santyl at least initially until it is cleaned up, and then perhaps I will put some sort of collagen or perhaps even an Apligraf, but at this point it does not look very good in terms of getting this area to heal with her overall health status with metastatic carcinoma.  She is afebrile today, her pulse is 99, respirations 17, blood pressure 127/85.  She weighs 98 pounds, and really looks like she is malnourished.  She is alert and answers questions.  She is somewhat hard of hearing, so we are treating her with Santyl.  I wrote her a prescription and the daughter will change this every day at home, and we will see her back here in a week.  So her diagnosis is metastatic carcinoma of the right breast with bony metastasis, liver metastasis, and due to the radiation, she has late radiation changes with an ulcer on her right chest wall.     Ardath Sax, [name]     PP/MEDQ  D:  06/20/2012  T:  06/21/2012  Job:  [id number]

## 2012-06-22 ENCOUNTER — Encounter (HOSPITAL_COMMUNITY): Payer: Self-pay | Admitting: Dentistry

## 2012-06-22 ENCOUNTER — Ambulatory Visit (HOSPITAL_COMMUNITY): Payer: Self-pay | Admitting: Dentistry

## 2012-06-22 VITALS — BP 133/81 | HR 90 | Temp 97.7°F

## 2012-06-22 DIAGNOSIS — M264 Malocclusion, unspecified: Secondary | ICD-10-CM

## 2012-06-22 DIAGNOSIS — K036 Deposits [accretions] on teeth: Secondary | ICD-10-CM

## 2012-06-22 DIAGNOSIS — K08409 Partial loss of teeth, unspecified cause, unspecified class: Secondary | ICD-10-CM

## 2012-06-22 DIAGNOSIS — C50919 Malignant neoplasm of unspecified site of unspecified female breast: Secondary | ICD-10-CM

## 2012-06-22 DIAGNOSIS — Z9229 Personal history of other drug therapy: Secondary | ICD-10-CM

## 2012-06-22 DIAGNOSIS — K0530 Chronic periodontitis, unspecified: Secondary | ICD-10-CM

## 2012-06-22 DIAGNOSIS — C801 Malignant (primary) neoplasm, unspecified: Secondary | ICD-10-CM

## 2012-06-22 MED ORDER — CHLORHEXIDINE GLUCONATE 0.12 % MT SOLN: OROMUCOSAL | Status: AC

## 2012-06-22 NOTE — Patient Instructions (Signed)
Plan: 1. Patient to continue chlorhexidine rinses 2-3 times daily and to maintain oral hygiene as best able.  2. I discussed the potential extraction of tooth #3 with the patient and her grandson, [name]. Due to the potential for osteonecrosis of the jaw with invasive dental extraction, the patient and grandson currently wished to defer dental extraction at this time. If however, patient develops pain, facial swelling, or infection in that area, we will consider proceeding with extraction at that time. 3.  Return to dental medicine in 3 months for re-evaluation. The patient may contact dental medicine if acute problems arise or if exposed bone is noted. Erline Hau, expresses understanding as well and will call for patient as needed. Dr. Kristin Bruins

## 2012-06-22 NOTE — Progress Notes (Signed)
06/22/2012    Patient:            Elizabeth [name] Date of Birth:  [birth year] MRN:                [id number]  BP 133/81  Pulse 90  Temp(Src) 97.7 F (36.5 C) (Oral)   Elizabeth [name] is an 79 yo female initially referred by Dr. Mancel Bale and is now followed by Dr. Arline Asp. Patient has Metastatic Breast Cancer to the Bone and has received multiple doses of of IV Zometa. Patient is at risk for bisphosphonate related osteonecrosis of the jaw (BRONJ).  Patient had three teeth (4,09,81) extracted on 12/11/2009. Patient without evidence of osteonecrosis of the jaw to date. Patient recently had a PET scan that showed some uptake activity in the upper right maxilla. Patient now presents for periodic oral examination and orthopantogram to evaluate the upper right maxilla. Patient also indicates that she recently lost a tooth "that just fell out" . This was 3-4 months ago. This involved the upper right maxilla. Patient denies any active pain or swelling in that area.  Patient Active Problem List  Diagnosis  . Breast cancer metastasized to bone   No Known Allergies  Current Outpatient Prescriptions  Medication Sig Dispense Refill  . bimatoprost (LUMIGAN) 0.01 % SOLN Place 1 drop into the left eye at bedtime.       . dorzolamide-timolol (COSOPT) 22.3-6.8 MG/ML ophthalmic solution Place 1 drop into the left eye 2 (two) times daily.       Marland Kitchen ENSURE (ENSURE) Take 1 Can by mouth daily.      Marland Kitchen everolimus (AFINITOR) 5 MG tablet Take 0.5 tablets (2.5 mg total) by mouth daily.  30 tablet  3  . exemestane (AROMASIN) 25 MG tablet Take 1 tablet (25 mg total) by mouth daily after breakfast.  30 tablet  3  . Multiple Vitamin (MULTIVITAMIN) tablet Take 1 tablet by mouth daily.       No current facility-administered medications for this visit.   Lab Results  Component Value Date   WBC 4.6 06/13/2012   HGB 10.0* 06/13/2012   HCT 30.8* 06/13/2012   MCV 75.1* 06/13/2012   PLT 261 06/13/2012   BMET    Component  Value Date/Time   NA 141 06/13/2012 1242   NA 140 02/04/2012 2119   K 3.4* 06/13/2012 1242   K 4.0 02/04/2012 2119   CL 103 06/13/2012 1242   CL 103 02/04/2012 2119   CO2 26 06/13/2012 1242   CO2 25 02/04/2012 2119   GLUCOSE 134* 06/13/2012 1242   GLUCOSE 121* 02/04/2012 2119   BUN 14.0 06/13/2012 1242   BUN 20 02/04/2012 2119   CREATININE 0.9 06/13/2012 1242   CREATININE 0.73 02/04/2012 2119   CALCIUM 9.4 06/13/2012 1242   CALCIUM 9.6 02/04/2012 2119   GFRNONAA 76* 02/04/2012 2119   GFRAA 88* 02/04/2012 2119   Dental Exam: General: Patient is a well-developed, slightly built female in no acute distress. Head and Neck: No lymphadenopathy is palpated. Patient denies acute TMJ symptoms. Intraoral exam: Normal saliva. No evidence of abscess formation. Dentition: Patient with multiple missing teeth. Patient with loss of tooth's 4 and 5 since the last dental visit in March of 2013.  Previous site of tooth numbers 4 and 5 have healed in completely with no evidence of exposed bone or retained root segments. Periodontal: Patient with chronic periodontitis with plaque and calculus accumulations. Multiple teeth with bone loss and tooth mobility. Tooth #3  with a class I+ mobility. This is nontender to palpation and is not sensitive to percussion by patient report. Dental Caries: No obvious dental caries are noted. Crown and bridge: Crown and bridge restorations with no acute changes. Prosthodontics: No history of partial dentures.  Occlusion: Poor occlusal scheme as before.  Radiographic: An orthopantogram was obtained today.  There are multiple missing teeth including recent loss of tooth numbers 4 and 5. There is no evidence of retained root segments or root tips.  Moderate to severe bone loss noted. The maxillary right sinus has increased opacity.  There is no evidence of exposed bone or osteonecrosis of the jaw. Multiple crown or bridge and other dental restorations as before.  Assessments: 1.  Recent loss of tooth numbers 4 and 5 over the past year. No evidence of exposed bone at this time. 2. Chronic periodontitis with bone loss 3. Tooth mobility with tooth #3 exhibiting class I+ mobility at this time. 4. Plaque and calculus accumulations-minimal. 5. Multiple missing teeth 6. Malocclusion 7. Risk for osteonecrosis of the jaw with invasive dental procedures  Plan: 1. Patient to continue chlorhexidine rinses 2-3 times daily and to maintain oral hygiene as best able.  2. I discussed the potential extraction of tooth #3 with the patient and her grandson, Elizabeth [name]. Due to the potential for osteonecrosis of the jaw with invasive dental extraction, the patient and grandson currently wished to defer dental extraction at this time. If however, patient develops pain, facial swelling, or infection in that area, we will consider proceeding with extraction at that time. 3.  Return to dental medicine in 3 months for re-evaluation. The patient may contact dental medicine if acute problems arise or if exposed bone is noted. Erline Hau, expresses understanding as well and will call for patient as needed.  Dr. [name]

## 2012-06-27 ENCOUNTER — Encounter (HOSPITAL_BASED_OUTPATIENT_CLINIC_OR_DEPARTMENT_OTHER): Payer: Medicare Other | Attending: General Surgery

## 2012-06-27 DIAGNOSIS — Y842 Radiological procedure and radiotherapy as the cause of abnormal reaction of the patient, or of later complication, without mention of misadventure at the time of the procedure: Secondary | ICD-10-CM | POA: Insufficient documentation

## 2012-06-27 DIAGNOSIS — L98499 Non-pressure chronic ulcer of skin of other sites with unspecified severity: Secondary | ICD-10-CM | POA: Insufficient documentation

## 2012-06-27 DIAGNOSIS — T66XXXS Radiation sickness, unspecified, sequela: Secondary | ICD-10-CM | POA: Insufficient documentation

## 2012-06-27 DIAGNOSIS — L918 Other hypertrophic disorders of the skin: Secondary | ICD-10-CM | POA: Insufficient documentation

## 2012-06-27 DIAGNOSIS — C50919 Malignant neoplasm of unspecified site of unspecified female breast: Secondary | ICD-10-CM | POA: Insufficient documentation

## 2012-07-25 ENCOUNTER — Telehealth: Payer: Self-pay | Admitting: Medical Oncology

## 2012-07-25 ENCOUNTER — Ambulatory Visit (HOSPITAL_BASED_OUTPATIENT_CLINIC_OR_DEPARTMENT_OTHER): Payer: Medicare Other | Admitting: Physician Assistant

## 2012-07-25 ENCOUNTER — Other Ambulatory Visit: Payer: Self-pay | Admitting: Physician Assistant

## 2012-07-25 ENCOUNTER — Encounter: Payer: Self-pay | Admitting: Medical Oncology

## 2012-07-25 ENCOUNTER — Other Ambulatory Visit (HOSPITAL_BASED_OUTPATIENT_CLINIC_OR_DEPARTMENT_OTHER): Payer: Medicare Other | Admitting: Lab

## 2012-07-25 VITALS — BP 118/69 | HR 118 | Temp 97.3°F | Resp 20 | Ht 64.0 in | Wt 93.3 lb

## 2012-07-25 DIAGNOSIS — C50919 Malignant neoplasm of unspecified site of unspecified female breast: Secondary | ICD-10-CM

## 2012-07-25 DIAGNOSIS — C782 Secondary malignant neoplasm of pleura: Secondary | ICD-10-CM

## 2012-07-25 DIAGNOSIS — C7952 Secondary malignant neoplasm of bone marrow: Secondary | ICD-10-CM

## 2012-07-25 DIAGNOSIS — C7951 Secondary malignant neoplasm of bone: Secondary | ICD-10-CM

## 2012-07-25 DIAGNOSIS — R634 Abnormal weight loss: Secondary | ICD-10-CM

## 2012-07-25 LAB — CBC WITH DIFFERENTIAL/PLATELET
BASO%: 0.3 % (ref 0.0–2.0)
Eosinophils Absolute: 0 10*3/uL (ref 0.0–0.5)
LYMPH%: 15 % (ref 14.0–49.7)
MCHC: 31.6 g/dL (ref 31.5–36.0)
MONO#: 0.4 10*3/uL (ref 0.1–0.9)
NEUT#: 5.6 10*3/uL (ref 1.5–6.5)
Platelets: 306 10*3/uL (ref 145–400)
RBC: 4.57 10*6/uL (ref 3.70–5.45)
WBC: 7.1 10*3/uL (ref 3.9–10.3)
lymph#: 1.1 10*3/uL (ref 0.9–3.3)

## 2012-07-25 LAB — COMPREHENSIVE METABOLIC PANEL (CC13)
ALT: 114 U/L — ABNORMAL HIGH (ref 0–55)
CO2: 26 mEq/L (ref 22–29)
Calcium: 9.1 mg/dL (ref 8.4–10.4)
Chloride: 99 mEq/L (ref 98–107)
Glucose: 197 mg/dl — ABNORMAL HIGH (ref 70–99)
Sodium: 140 mEq/L (ref 136–145)
Total Protein: 8.1 g/dL (ref 6.4–8.3)

## 2012-07-25 LAB — LACTATE DEHYDROGENASE (CC13): LDH: 454 U/L — ABNORMAL HIGH (ref 125–245)

## 2012-07-25 MED ORDER — POTASSIUM CHLORIDE CRYS ER 20 MEQ PO TBCR: 20.0000 meq | EXTENDED_RELEASE_TABLET | Freq: Two times a day (BID) | ORAL | Status: AC

## 2012-07-25 NOTE — Telephone Encounter (Signed)
I called Gail-daughter to let her know that her mother's potassium is low today. Dr.Murinson would like for her to take potassium 20 meq twice a day for two days. I explained that she can break these in half if her mother can not swallow well. I asked her if she has heard from Hospice and she stated yes. They will be at the home at 1100 am to evaluate the situation. I asked her to call us if we can help her in any way. She voiced understanding and thanked [place] for taking such good care of her mother. This was shared with Dr. Arline Asp.

## 2012-07-25 NOTE — Progress Notes (Signed)
[facility]  [phone]   CC: Elizabeth [name], [name], [name]  [name], [name]  OFFICE PROGRESS NOTE  PROBLEM LIST:   1. Metastatic breast cancer originating in the right breast, neglected primary with diagnosis going back to October 2008. At presentation the patient had a right trapped lung with pleural disease, extensive bone metastases and probable liver metastases. Biopsy showed invasive ductal cancer. Estrogen receptor 91%, progesterone receptor 1%, HER-2/neu was 1+. At that time the patient was having fairly severe pain in her back. Also had impending T10 cord compression. She received radiation treatments to the thoracic spine from T7-T12 from 02/02/2007 through 02/21/2007. She was started on Femara 2.5 mg a day on 02/09/2007 and Zometa on 03/28/2010 after receiving dental clearance. The patient was noted to have progression on her right chest wall in March 2011 and received radiation treatments to the right chest wall 2000 cGy in 10 fractions from 10/10/2009 through 10/23/2009. She does have elevated tumor markers, specifically CEA and CA 27.29. On 02/09/2007, CA27.29 was 2883 and the CEA was 3331. The patient received Femara 2.5 mg daily from 02/09/2007 through 07/28/2011 initially with a dramatic response but in recent months has had slow progression. The patient received Faslodex 500 mg IM from 07/28/2011 through 03/03/2012. She had excellent control of her disease and a partial response as evidenced by tumor markers. The PET scan from 03/11/2012 compared with the PET scan from 01/13/2011 shows progressive disease. Tumor markers are also increasing. The PET scan shows new metastatic disease within the liver. These lesions are intensely hypermetabolic and by CT measure 1.5 to 4 cm. In addition, the patient had a mixed response within the bones with several new sites of activity. There is also new activity within the inferior right maxilla with a focus of bone  destruction just above the alveolar ridge and intense hypermetabolic activity. There was no evidence for pulmonary metastatic disease. The patient was started on Aromasin in combination with Afinitor in early December 2013. 2.2. Hearing impairment.  3. Glaucoma.  4. Gallstones evident on CT scan.  5. Weight loss. The patient weighed approximately 120 pounds prior to diagnosis of metastatic breast cancer in October 2008. Her weight has  been progressively decreasing over the past few years. The patient complains of anorexia.  6. Nonhealing ulcer on right chest wall dating back at least a couple of years.  7. DNR/NCB discussed on office visit of 03/28/2012.    MEDICATIONS:  Current Outpatient Prescriptions  Medication Sig Dispense Refill  . bimatoprost (LUMIGAN) 0.01 % SOLN Place 1 drop into the left eye at bedtime.       . chlorhexidine (PERIDEX) 0.12 % solution Rinse with 15 mls three times a day for 30 seconds. Spit out excess. Do not swallow.  480 mL  PRN  . dorzolamide-timolol (COSOPT) 22.3-6.8 MG/ML ophthalmic solution Place 1 drop into the left eye 2 (two) times daily.       Marland Kitchen ENSURE (ENSURE) Take 1 Can by mouth daily.      Marland Kitchen everolimus (AFINITOR) 5 MG tablet Take 0.5 tablets (2.5 mg total) by mouth daily.  30 tablet  3  . exemestane (AROMASIN) 25 MG tablet Take 1 tablet (25 mg total) by mouth daily after breakfast.  30 tablet  3  . Multiple Vitamin (MULTIVITAMIN) tablet Take 1 tablet by mouth daily.       No current facility-administered medications for this visit.  -Aromasin 25 mg daily started in early December 2013.  -  Afinitor 2.5 mg daily started in mid December 2013.  -Zometa 3.5 mg IV was given every 2 months from 03/28/2010 through 03/03/2012.   ALLERGIES:  No Known Allergies  SMOKING HISTORY: The patient has never smoked cigarettes  HISTORY: I saw Elizabeth [name] today for followup of her metastatic breast cancer with widespread involvement involving bones, right  chest  wall, right lung, and possibly liver. The PET scan from 03/11/2012 showed progressive disease. Elizabeth [name] was last seen by [place] on on 06/13/12. She was accompanied by her 2 children,, Elizabeth [name] and Elizabeth [name].Unfortunately, she has lost another 4 pounds.Her daughter reports that she is not taking more than 1 Ensure a day due to significant decrease in appetite. She has been seen at the wound clinic, and has been having dressing changes every 2 days. Her grandson Elizabeth [name] providesthe dressing changes. According to the notes, her wounds are healing appropiately. She continues to take Aromasin 25 mg a day a s well as Afinitor 2.5 mg daily. Daughter reports that patient has declined cognitively, appearing more confused over the last 3 months, especially over the last week. She can no longer be left alone for a few minuts. She has to be constantly monitored, which is placing a burden on her family, who cannot care for her 24/7 at risk of losing their jobs. They are interested in long term placement at this point.    PHYSICAL EXAMINATION:   Filed Vitals:   07/25/12 1118  BP: 118/69  Pulse: 118  Temp: 97.3 F (36.3 C)  Resp: 20   Filed Weights   07/25/12 1118  Weight: 93 lb 4.8 oz (42.321 kg)   The patient is frail and elderly.She is now wheelchair bound during her visit. HEENT: There is no scleral icterus. Mouth and pharynx are benign. There is no peripheral adenopathy palpable. Heart and lungs: Normal. Chest: The ulcer on the right chest wall is bandaged There is no nodularity around the margin or at the base of this  shallow ulcer. No other definite changes on the chest wall which has undergone radiation treatments. Abdomen: Benign with no organomegaly or masses palpable. Extremities: No peripheral edema. Neurologic: Normal. Access: The patient does not have a Port-A-Cath or central catheter.  LABORATORY/RADIOLOGY DATA:   Recent Labs Lab 07/25/12 1050  WBC 7.1  HGB 10.4*  HCT 32.8*  PLT 306  MCV  71.9*  MCH 22.7*  MCHC 31.6  RDW 19.6*  LYMPHSABS 1.1  MONOABS 0.4  EOSABS 0.0  BASOSABS 0.0    CMP    Recent Labs Lab 07/25/12 1050  NA 140  K 3.1*  CL 99  CO2 26  GLUCOSE 197*  BUN 13.3  CREATININE 0.8  CALCIUM 9.1  AST 224*  ALT 114*  ALKPHOS 112  BILITOT 0.85    On 06/13/12, white count 4.6, ANC 2.8, hemoglobin 10.0, hematocrit 30.8, platelets 261,000. Chemistries notable for an albumin of 2.8, down from 3.1 on 04/25/2012. The potassium was 3.4. Liver function tests were normal including an LDH of 244. CEA was increased  87.7 and CA27.29 was 622.   On 04/25/2012 CEA was 65.6 compared with 62.4 on 03/28/2012. CA27.29 was 456 as compared with 419 on 03/28/2012.   Radiology Studies:  IMAGING STUDIES:  1. CT scan of the chest, abdomen and pelvis with IV contrast on 08/22/2009 showed some improvement in the transpleural spread of tumor involving the right hemithorax. The right effusion has decreased in size from the prior exam of 01/31/2007. There is a  decrease in the size of the spiculated right middle lobe lesion. There is diffuse bone metastases with some interval healing of the destructive lesion involving the posterior elements of T10. The right chest wall lesion had not significantly changed in size. There was a new lesion adjacent to the chest wall lesion measuring 0.9 cm. There were gallstones noted. No focal liver abnormalities.  2. Nuclear medicine whole bone scan from 08/22/2009 showed diffuse osseous metastases involving the axillary appendicular skeleton.  3. CT scan of the chest, abdomen and pelvis with IV contrast on 07/30/2010 showed no change in the right chest wall masses, similar lung nodules, similar right-sided pleural effusion and pleural thickening and similar osseous metastases. There was no evidence for extra osseous metastases within the abdomen and pelvis. Cholelithiasis was present. These scans were compared with the CT scans of 08/14/2009.  4. Bone  scan from 07/30/2010 showed widespread osseous metastatic disease.  5. PET scan from 01/13/2011 showed hypermetabolic nodule within the right chest wall consistent with malignancy. There was adjacent skin thickening. There was a probable hypermetabolic nodule or atelectasis at the right lung base. There was a stable right lower lobe pulmonary nodule. There was widespread metastatic disease in the skeleton that was unchanged. Several of the sclerotic lesions were hypermetabolic consistent with active metastatic process.  6. CT scan of head without IV contrast on 02/04/2012 showed no acute intracranial finding. Chronic sinusitis was present.  7. PET scan from 03/11/2012 showed new metastatic disease within the liver with multiple intensely hypermetabolic lesions. By CT scan the lesions  measured 1.5 to 4 cm. Lesions number approximately 20. There was mixed response within the skeleton with several new sites of activity while  others had improved. Overall there was widespread skeletal metastatic disease. There was new activity within the inferior right maxilla as compared with the prior PET scan of 01/13/2011. There was a focus of bone destruction within the left maxillary bone just above the alveolar ridge as seen on CT scan. No evidence for pulmonary metastatic disease.    ASSESSMENT AND PLAN:   Elizabeth [name]  Condition continues to deteriorate due to progression of the disease.  Her weight continues to drop.. Last visit she was given samples of Ensure Complete containing 350 kilocalories per serving. The patient has been urged to take 2 or 3 of these in a day. She is taking in 1 a day. Her appetite has changed  significantly since last week. SHe demonstrates aversion to food per children report. Her tumor markers are currently pending, but based on the last markers available on 2/17, there is a  Significant increase in CEA at  87.7 and CA27.29 at  622. Dr. Arline Asp has seen and evaluated the patient and  has had an extensive, greater than one hour discussion with the patient and her daughter Elizabeth [name] and son Elizabeth [name], regarding the current clinical status. Disease progression is evident. Her children would lie to consider long term placement to care for their mother for she can no longer care for herself, requiring 24 hour surveillance. [facility] referral was discussed, her children agreed with the plan. An appointment is to be arranged within the next 48 hours for patient placement. At this time, Aromasin 25 mg a day and Afinitor 2.5 mg daily are to be discontinued due progression of disease. She is to continue with wound care plans to the right chest wall by the wound care clinic She is to return to the office of the Cancer Center as  needed. All the questions were answered. If any concerns, Dr. Arline Asp encouraged her family to call us .     Lavon Bothwell E, PA-C 07/25/2012, 1:19 PM

## 2012-07-25 NOTE — Progress Notes (Signed)
Per pt, daughter June Leap [phone] and son Ruth Kovich [phone] referral made to [facility]. I spoke with Cordelia Pen with hospice. They will call the daughter with a visit appt.

## 2012-07-28 ENCOUNTER — Telehealth: Payer: Self-pay | Admitting: Medical Oncology

## 2012-07-28 NOTE — Telephone Encounter (Signed)
Daughter Dondra Spry called and has concerns about her meeting with hospice. She states that the social worker Bobetta Lime told her that pt can not go to [facility] due to [employer]. They directed her to call [facility] and when she called they told her he mother needs to be admitted for 3 days and then referred. If not, they will have to pay 6000.00. I explained to her this is not what we had planned and she did not need to do this. I will have Dr. Arline Asp call Hospice to see where the problems are and what we can do to help get her mother into [facility]. I will be in contact with her.

## 2012-07-28 NOTE — Telephone Encounter (Signed)
I called Dondra Spry to inform her that Dr. Arline Asp spoke with someone at hospice and explained the why he feels pt is a candidate for hospice and [facility]. They told him they did not think there should be an issue. I asked Dondra Spry to call back if this does not get resolved.

## 2012-08-01 ENCOUNTER — Encounter (HOSPITAL_BASED_OUTPATIENT_CLINIC_OR_DEPARTMENT_OTHER): Payer: Medicare Other | Attending: General Surgery

## 2012-08-17 ENCOUNTER — Telehealth: Payer: Self-pay

## 2012-08-17 NOTE — Telephone Encounter (Signed)
Elizabeth [name] called stating she spoke with a Child psychotherapist at [facility] who stated her mother may be a candidate for assisted living. Elizabeth [name] disagreed stating her mother cannot walk, is incontinent and needs her diapers changed and has the mind of a child.  I called [facility] and spoke with Elizabeth [name] who stated the pt is getting up to the bathroom on her own, is eating 3 meals per day and gaining weight, she is dressed every day and in her wheelchair, she is having no pain and is sleeping well. We discussed that [facility] may not be the best placement for the pt at this time and they are starting the conversation about assisted living or skilled nursing facility with the family. The pt would still be hospice eligible but just not [facility] at this time. Also that the pt has more socialization outlet at [facility] than at her house. Elizabeth [name] said she would talk to [facility]. This information forwarded to Dr Arline Asp

## 2012-08-19 ENCOUNTER — Telehealth: Payer: Self-pay

## 2012-08-19 NOTE — Telephone Encounter (Signed)
Elizabeth [name] stated that Elizabeth [name] did not call her. Elizabeth [name] says she is at [facility] every day. Elizabeth [name] says that the only way her mother gets up to the bathroom is if the staff lifts her up to the walker and she can take a few steps with someone right beside her. Elizabeth [name] states that she was told her mother can be at [facility] place up to 3 months. She is requesting prognosis and if there is anything else to keep her mother at [facility]. She does not want to move her mother to another facility.

## 2012-08-19 NOTE — Telephone Encounter (Signed)
S/w Dr Arline Asp then called Dondra Spry back. At this point Dr Arline Asp does not feel he can give a prognosis of length of life. He cannot think of anything else to specifically keep pt at [facility].

## 2012-08-25 ENCOUNTER — Telehealth: Payer: Self-pay | Admitting: Medical Oncology

## 2012-08-25 NOTE — Telephone Encounter (Signed)
Garth Bigness from [facility] called to inform us that pt is being transferred to [facility] today. Hospice will continue to follow her. Dr. Arline Asp notified.

## 2012-09-26 ENCOUNTER — Telehealth (HOSPITAL_COMMUNITY): Payer: Self-pay | Admitting: Dental General Practice

## 2012-09-26 NOTE — Telephone Encounter (Signed)
09/26/2012 Discussion with patients daughter Elizabeth [name] concerning her appointment with dental medicine. She explained that  Her mother has been moved to a nursing facility. If any problem arises she will call back and schedule another appointment. djs

## 2012-09-28 ENCOUNTER — Other Ambulatory Visit (HOSPITAL_COMMUNITY): Payer: Self-pay | Admitting: Dentistry

## 2013-05-16 ENCOUNTER — Telehealth: Payer: Self-pay

## 2013-05-16 NOTE — Telephone Encounter (Signed)
Patient past away per Obituary in [employer] °

## 2013-05-25 IMAGING — PT NM PET TUM IMG RESTAG (PS) SKULL BASE T - THIGH
1 of 6 series · 1 of 25 positions shown · non-contrast
Comparison: PET CT 01/13/2011

CLINICAL DATA: Subsequent treatment strategy for breast cancer.

NUCLEAR MEDICINE PET SKULL BASE TO THIGH
Fasting Blood Glucose:  92
TECHNIQUE: 18.2  mCi F-18 FDG was injected intravenously.   CT
data was obtained and used for attenuation correction and anatomic
localization only.  (This was not acquired as a diagnostic CT
examination.) Additional exam technical data entered  on
technologist worksheet.

[Series 2: ct images · axial · 3.8mm · 0.98mm/px · 1 of 267 slices shown]
[im 267/267  brain]
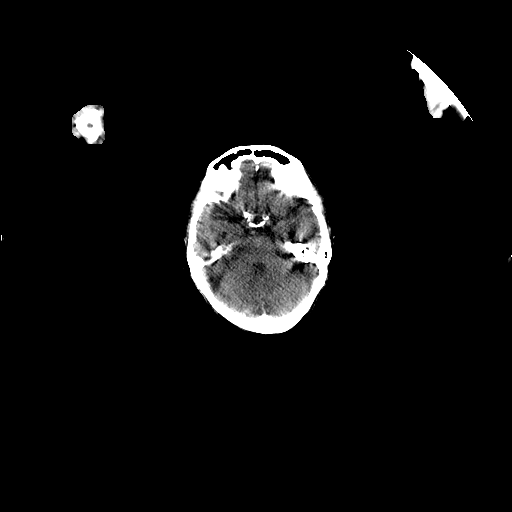

[1 of 25 positions shown; findings below may reference images not displayed]

FINDINGS: Neck: There is a focus of bone destruction within the left
maxillary bone just above the alveolar ridge with intense
hypermetabolic activity (image 20, SUV max 13.5.) There is
opacification of the right maxillary sinus superior to this new
bone lesions which is new compared to comparison PET CT scan.

Chest: There is persistent hypermetabolic activity within the right
chest wall reduced compared to prior.  Additionally there is
persistent but reduced metabolic activity within the sternum ( SUV
max = 5.3 compared SUV max = 6.6).  No hypermetabolic mediastinal
lymph nodes. No hypermetabolic pulmonary nodules.

Abdomen / Pelvis: Multiple new hepatic metastasis.  These lesions
are enlarged and represented by a low density round lesions on the
CT portion measuring up from 1.5 to 4 cm.   These lesions number
approximately 20 with exemplary lesions with S U V max =  15.5.  T

Skeleton:There is mixed response within the skeleton.  Several
lesions have been reduced in metabolic activity in  the thoracic
spine however there is a new intense in the thoracic spine at the
T10 left pedicle and increased activity in the proximal humeri. New
lesion in the left mid femoral diaphysis with SUV max = 9.0.
IMPRESSION: 1..  New metastatic disease within the liver with multiple
intensely hypermetabolic lesions.
2.  Mixed response within the skeleton with several new sites of
activity while others have improved.  Overall there is widespread
skeletal metastasis.
3.  New activity within the inferior right maxilla.  This is
concerning for a skeletal metastasis although an aggressive
infectious process could have similar appearance. There is new
opacification of the right maxillary sinus.

4.  No evidence of pulmonary metastasis.

## 2013-05-28 DEATH — deceased

## 2013-12-20 ENCOUNTER — Other Ambulatory Visit: Payer: Self-pay | Admitting: Pharmacist
